# Patient Record
Sex: Female | Born: 1950
Health system: Southern US, Community
[De-identification: ages and names within clinical notes are randomized; demographics above are authoritative.]

## PROBLEM LIST (undated history)

## (undated) DIAGNOSIS — I639 Cerebral infarction, unspecified: Secondary | ICD-10-CM

## (undated) DIAGNOSIS — I10 Essential (primary) hypertension: Secondary | ICD-10-CM

## (undated) DIAGNOSIS — E782 Mixed hyperlipidemia: Secondary | ICD-10-CM

## (undated) DIAGNOSIS — E559 Vitamin D deficiency, unspecified: Secondary | ICD-10-CM

## (undated) DIAGNOSIS — R7302 Impaired glucose tolerance (oral): Secondary | ICD-10-CM

## (undated) DIAGNOSIS — I63532 Cerebral infarction due to unspecified occlusion or stenosis of left posterior cerebral artery: Secondary | ICD-10-CM

## (undated) DIAGNOSIS — J3089 Other allergic rhinitis: Secondary | ICD-10-CM

## (undated) HISTORY — PX: OTHER SURGICAL HISTORY: SHX169

## (undated) HISTORY — DX: Essential (primary) hypertension: I10

## (undated) HISTORY — DX: Vitamin D deficiency, unspecified: E55.9

## (undated) HISTORY — DX: Cerebral infarction due to unspecified occlusion or stenosis of left posterior cerebral artery: I63.532

## (undated) HISTORY — DX: Impaired glucose tolerance (oral): R73.02

## (undated) HISTORY — DX: Other allergic rhinitis: J30.89

## (undated) HISTORY — DX: Mixed hyperlipidemia: E78.2

---

## 2016-05-03 ENCOUNTER — Other Ambulatory Visit: Payer: Self-pay | Admitting: Physician Assistant

## 2016-05-03 DIAGNOSIS — Z1231 Encounter for screening mammogram for malignant neoplasm of breast: Secondary | ICD-10-CM

## 2016-05-31 ENCOUNTER — Encounter (HOSPITAL_COMMUNITY): Payer: Self-pay

## 2016-05-31 ENCOUNTER — Ambulatory Visit
Admission: RE | Admit: 2016-05-31 | Discharge: 2016-05-31 | Disposition: A | Discharge status: Home / Self Care | Payer: No Typology Code available for payment source | Source: Ambulatory Visit | Attending: Physician Assistant | Admitting: Physician Assistant

## 2016-05-31 DIAGNOSIS — R928 Other abnormal and inconclusive findings on diagnostic imaging of breast: Secondary | ICD-10-CM | POA: Insufficient documentation

## 2016-05-31 DIAGNOSIS — Z1231 Encounter for screening mammogram for malignant neoplasm of breast: Secondary | ICD-10-CM

## 2016-06-08 ENCOUNTER — Other Ambulatory Visit: Payer: Self-pay | Admitting: Physician Assistant

## 2016-06-08 DIAGNOSIS — R928 Other abnormal and inconclusive findings on diagnostic imaging of breast: Secondary | ICD-10-CM

## 2016-06-08 DIAGNOSIS — N6320 Unspecified lump in the left breast, unspecified quadrant: Secondary | ICD-10-CM

## 2016-06-13 ENCOUNTER — Ambulatory Visit
Admission: RE | Admit: 2016-06-13 | Discharge: 2016-06-13 | Disposition: A | Discharge status: Home / Self Care | Payer: No Typology Code available for payment source | Source: Ambulatory Visit | Attending: Physician Assistant | Admitting: Physician Assistant

## 2016-06-13 DIAGNOSIS — N6320 Unspecified lump in the left breast, unspecified quadrant: Secondary | ICD-10-CM

## 2016-06-13 DIAGNOSIS — R928 Other abnormal and inconclusive findings on diagnostic imaging of breast: Secondary | ICD-10-CM

## 2016-06-13 DIAGNOSIS — N6002 Solitary cyst of left breast: Secondary | ICD-10-CM | POA: Diagnosis not present

## 2016-06-16 ENCOUNTER — Other Ambulatory Visit: Payer: Self-pay | Admitting: Physician Assistant

## 2016-06-16 DIAGNOSIS — D242 Benign neoplasm of left breast: Secondary | ICD-10-CM

## 2016-12-18 ENCOUNTER — Ambulatory Visit: Payer: PRIVATE HEALTH INSURANCE | Attending: Physician Assistant

## 2018-03-06 ENCOUNTER — Other Ambulatory Visit: Payer: Self-pay | Admitting: Family Medicine

## 2018-03-06 DIAGNOSIS — N6320 Unspecified lump in the left breast, unspecified quadrant: Secondary | ICD-10-CM

## 2018-03-13 ENCOUNTER — Ambulatory Visit
Admission: RE | Admit: 2018-03-13 | Discharge: 2018-03-13 | Disposition: A | Discharge status: Home / Self Care | Payer: PRIVATE HEALTH INSURANCE | Source: Ambulatory Visit | Attending: Family Medicine | Admitting: Family Medicine

## 2018-03-13 ENCOUNTER — Ambulatory Visit
Admission: RE | Admit: 2018-03-13 | Discharge: 2018-03-13 | Disposition: A | Discharge status: Home / Self Care | Payer: Medicare HMO | Source: Ambulatory Visit | Attending: Family Medicine | Admitting: Family Medicine

## 2018-03-13 DIAGNOSIS — N6320 Unspecified lump in the left breast, unspecified quadrant: Secondary | ICD-10-CM

## 2021-02-14 ENCOUNTER — Emergency Department: Payer: Medicare HMO

## 2021-02-14 ENCOUNTER — Inpatient Hospital Stay
Admission: EM | Admit: 2021-02-14 | Discharge: 2021-02-18 | DRG: 065 | Disposition: A | Discharge status: Home Health | Payer: Medicare HMO | Attending: Internal Medicine | Admitting: Internal Medicine

## 2021-02-14 ENCOUNTER — Other Ambulatory Visit: Payer: Self-pay

## 2021-02-14 DIAGNOSIS — Z79899 Other long term (current) drug therapy: Secondary | ICD-10-CM | POA: Diagnosis not present

## 2021-02-14 DIAGNOSIS — J323 Chronic sphenoidal sinusitis: Secondary | ICD-10-CM | POA: Diagnosis not present

## 2021-02-14 DIAGNOSIS — R4182 Altered mental status, unspecified: Secondary | ICD-10-CM | POA: Diagnosis not present

## 2021-02-14 DIAGNOSIS — Z9114 Patient's other noncompliance with medication regimen: Secondary | ICD-10-CM | POA: Diagnosis not present

## 2021-02-14 DIAGNOSIS — J0110 Acute frontal sinusitis, unspecified: Secondary | ICD-10-CM | POA: Diagnosis not present

## 2021-02-14 DIAGNOSIS — R29701 NIHSS score 1: Secondary | ICD-10-CM | POA: Diagnosis present

## 2021-02-14 DIAGNOSIS — G8194 Hemiplegia, unspecified affecting left nondominant side: Secondary | ICD-10-CM | POA: Diagnosis present

## 2021-02-14 DIAGNOSIS — J329 Chronic sinusitis, unspecified: Secondary | ICD-10-CM | POA: Diagnosis present

## 2021-02-14 DIAGNOSIS — R5381 Other malaise: Secondary | ICD-10-CM | POA: Diagnosis not present

## 2021-02-14 DIAGNOSIS — Z7982 Long term (current) use of aspirin: Secondary | ICD-10-CM | POA: Diagnosis not present

## 2021-02-14 DIAGNOSIS — G9349 Other encephalopathy: Secondary | ICD-10-CM | POA: Diagnosis present

## 2021-02-14 DIAGNOSIS — M47812 Spondylosis without myelopathy or radiculopathy, cervical region: Secondary | ICD-10-CM | POA: Diagnosis not present

## 2021-02-14 DIAGNOSIS — R410 Disorientation, unspecified: Secondary | ICD-10-CM | POA: Diagnosis not present

## 2021-02-14 DIAGNOSIS — R531 Weakness: Secondary | ICD-10-CM | POA: Diagnosis not present

## 2021-02-14 DIAGNOSIS — E7849 Other hyperlipidemia: Secondary | ICD-10-CM | POA: Diagnosis not present

## 2021-02-14 DIAGNOSIS — I639 Cerebral infarction, unspecified: Secondary | ICD-10-CM | POA: Diagnosis present

## 2021-02-14 DIAGNOSIS — E785 Hyperlipidemia, unspecified: Secondary | ICD-10-CM | POA: Diagnosis not present

## 2021-02-14 DIAGNOSIS — J320 Chronic maxillary sinusitis: Secondary | ICD-10-CM | POA: Diagnosis not present

## 2021-02-14 DIAGNOSIS — I63432 Cerebral infarction due to embolism of left posterior cerebral artery: Principal | ICD-10-CM | POA: Diagnosis present

## 2021-02-14 DIAGNOSIS — Z6841 Body Mass Index (BMI) 40.0 and over, adult: Secondary | ICD-10-CM | POA: Diagnosis not present

## 2021-02-14 DIAGNOSIS — Z20822 Contact with and (suspected) exposure to covid-19: Secondary | ICD-10-CM | POA: Diagnosis not present

## 2021-02-14 DIAGNOSIS — I614 Nontraumatic intracerebral hemorrhage in cerebellum: Secondary | ICD-10-CM | POA: Diagnosis not present

## 2021-02-14 DIAGNOSIS — I160 Hypertensive urgency: Secondary | ICD-10-CM | POA: Diagnosis not present

## 2021-02-14 DIAGNOSIS — G459 Transient cerebral ischemic attack, unspecified: Secondary | ICD-10-CM | POA: Diagnosis not present

## 2021-02-14 DIAGNOSIS — I6522 Occlusion and stenosis of left carotid artery: Secondary | ICD-10-CM | POA: Diagnosis not present

## 2021-02-14 DIAGNOSIS — I6523 Occlusion and stenosis of bilateral carotid arteries: Secondary | ICD-10-CM | POA: Diagnosis not present

## 2021-02-14 DIAGNOSIS — R930 Abnormal findings on diagnostic imaging of skull and head, not elsewhere classified: Secondary | ICD-10-CM | POA: Diagnosis present

## 2021-02-14 DIAGNOSIS — J324 Chronic pansinusitis: Secondary | ICD-10-CM | POA: Diagnosis not present

## 2021-02-14 DIAGNOSIS — I6782 Cerebral ischemia: Secondary | ICD-10-CM | POA: Diagnosis not present

## 2021-02-14 DIAGNOSIS — R5383 Other fatigue: Secondary | ICD-10-CM

## 2021-02-14 DIAGNOSIS — I10 Essential (primary) hypertension: Secondary | ICD-10-CM | POA: Diagnosis not present

## 2021-02-14 DIAGNOSIS — I161 Hypertensive emergency: Secondary | ICD-10-CM | POA: Diagnosis not present

## 2021-02-14 DIAGNOSIS — I6381 Other cerebral infarction due to occlusion or stenosis of small artery: Secondary | ICD-10-CM | POA: Diagnosis not present

## 2021-02-14 DIAGNOSIS — I63532 Cerebral infarction due to unspecified occlusion or stenosis of left posterior cerebral artery: Secondary | ICD-10-CM | POA: Diagnosis present

## 2021-02-14 DIAGNOSIS — Q283 Other malformations of cerebral vessels: Secondary | ICD-10-CM | POA: Diagnosis not present

## 2021-02-14 LAB — CBC
HCT: 40.4 % (ref 36.0–46.0)
Hemoglobin: 13 g/dL (ref 12.0–15.0)
MCH: 31.4 pg (ref 26.0–34.0)
MCHC: 32.2 g/dL (ref 30.0–36.0)
MCV: 97.6 fL (ref 80.0–100.0)
Platelets: 276 10*3/uL (ref 150–400)
RBC: 4.14 MIL/uL (ref 3.87–5.11)
RDW: 13.4 % (ref 11.5–15.5)
WBC: 7.9 10*3/uL (ref 4.0–10.5)
nRBC: 0 % (ref 0.0–0.2)

## 2021-02-14 LAB — BASIC METABOLIC PANEL
Anion gap: 9 (ref 5–15)
BUN: 21 mg/dL (ref 8–23)
CO2: 28 mmol/L (ref 22–32)
Calcium: 9.3 mg/dL (ref 8.9–10.3)
Chloride: 101 mmol/L (ref 98–111)
Creatinine, Ser: 0.96 mg/dL (ref 0.44–1.00)
GFR, Estimated: >60 mL/min (ref >60)
Glucose, Bld: 112 mg/dL — ABNORMAL HIGH (ref 70–99)
Potassium: 3.6 mmol/L (ref 3.5–5.1)
Sodium: 138 mmol/L (ref 135–145)

## 2021-02-14 LAB — RESP PANEL BY RT-PCR (FLU A&B, COVID) ARPGX2
Influenza A by PCR: NEGATIVE
Influenza B by PCR: NEGATIVE
SARS Coronavirus 2 by RT PCR: NEGATIVE

## 2021-02-14 MED ORDER — ENOXAPARIN SODIUM 60 MG/0.6ML IJ SOSY
0.5000 mg/kg | PREFILLED_SYRINGE | INTRAMUSCULAR | Status: DC
  Administered 2021-02-15 – 2021-02-18 (×4): 57.5 mg via SUBCUTANEOUS
  Filled 2021-02-14 (×4): qty 0.6

## 2021-02-14 MED ORDER — SODIUM CHLORIDE 0.9 % IV SOLN
1.5000 g | Freq: Once | INTRAVENOUS | Status: AC
  Administered 2021-02-15: 1.5 g via INTRAVENOUS
  Filled 2021-02-14: qty 1.5

## 2021-02-14 MED ORDER — STROKE: EARLY STAGES OF RECOVERY BOOK: Freq: Once | Status: DC

## 2021-02-14 MED ORDER — ACETAMINOPHEN 160 MG/5ML PO SOLN
650.0000 mg | ORAL | Status: DC | PRN
  Filled 2021-02-14: qty 20.3

## 2021-02-14 MED ORDER — ACETAMINOPHEN 650 MG RE SUPP: 650.0000 mg | RECTAL | Status: DC | PRN

## 2021-02-14 MED ORDER — ACETAMINOPHEN 325 MG PO TABS
650.0000 mg | ORAL_TABLET | ORAL | Status: DC | PRN
  Administered 2021-02-15 – 2021-02-18 (×4): 650 mg via ORAL
  Filled 2021-02-14 (×4): qty 2

## 2021-02-14 MED ORDER — SODIUM CHLORIDE 0.9 % IV SOLN: INTRAVENOUS | Status: DC

## 2021-02-14 NOTE — ED Notes (Signed)
Attempted to  start IV on pt. Unsuccessful

## 2021-02-14 NOTE — H&P (Signed)
History and Physical    Karen Chan PJK:[id number] DOB: March 19, 1950 DOA: 02/14/2021  PCP: Buzzy Han, MD   Patient coming from: home  I have personally briefly reviewed patient's relevant medical records in [place]  Chief Complaint: confusion x 1 week  HPI: Karen Chan is a 71 y.o. female with medical history significant for HTN who was brought in by her family with a 1 week history of mild confusion.  Most of the history is given by husband at bedside who stated for the past week patient seemed to have difficulty getting her words out.  About 3 days ago patient stated that she was driving to a familiar place but seem to have trouble finding the place and had to stop about 3 times to try to think and figure out how to get to where she was going.  Due to persistent symptoms along with dizziness they decided to come into the emergency room to get evaluated  ED course: BP 192/109 - 208/97 with otherwise normal vitals Blood work unremarkable  EKG, personally viewed and interpreted: NSR at 91 with no acute ST-T wave changes  Imaging: CT head: Hypodensity left occipital lobe concerning for acute infarct Air-fluid level left sphenoid sinus concerning for sinusitis, correlate with symptoms  MRI ordered from the ED, result pending.  Started on Unasyn for possible sinusitis.  Hospitalist consulted for admission.  Chest   Review of Systems: As per HPI otherwise all other systems on review of systems negative.   Assessment/Plan  Acute confusion - Differential includes acute stroke given abnormality on CT head,PRES related to uncontrolled hypertension, less likely metabolic encephalopathy related to acute infectious process - Follow MRI - Neurologic checks with fall and aspiration precautions  Probable acute CVA - Acute confusion without focal deficit, with last known well a week prior - Follow MRI - Stroke work-up to include continuous cardiac  monitoring, carotid Doppler and echocardiogram - We will give aspirin - Permissive hypertension to systolic 809, maintain euglycemia and normothermia - Lipid profile - Neurology consult -Addendum: MRI confirms acute CVA  Hypertensive urgency - Hold antihypertensives for now allowing for permissive hypertension for the next 24 hours pending MRI results    Sinusitis - CT head showing left sphenoid sinusitis - Antibiotics started in the ED.  Not continued at this time - Decongestants, Flonase nasal spray, symptomatic treatment   DVT prophylaxis: Lovenox  Code Status: full code  Family Communication:  none  Disposition Plan: Back to previous home environment Consults called: Neurology Status:At the time of admission, it appears that the appropriate admission status for this patient is INPATIENT. This is judged to be reasonable and necessary in order to provide the required intensity of service to ensure the patient's safety given the presenting symptoms, physical exam findings, and initial radiographic and laboratory data in the context of their  Comorbid conditions.   Patient requires inpatient status due to high intensity of service, high risk for further deterioration and high frequency of surveillance required.   I certify that at the point of admission it is my clinical judgment that the patient will require inpatient hospital care spanning beyond 2 midnights     Physical Exam: Vitals:   02/14/21 1403 02/14/21 2152  BP: (!) 192/109 (!) 208/97  Pulse: 86 84  Resp: 16 17  Temp: 98.4 F (36.9 C) 98.2 F (36.8 C)  TempSrc: Oral Oral  SpO2: 95% 98%   Constitutional: Alert, oriented x 3 . Not in any apparent  distress HEENT:      Head: Normocephalic and atraumatic.         Eyes: PERLA, EOMI, Conjunctivae are normal. Sclera is non-icteric.       Mouth/Throat: Mucous membranes are moist.       Neck: Supple with no signs of meningismus. Cardiovascular: Regular rate and rhythm. No  murmurs, gallops, or rubs. 2+ symmetrical distal pulses are present . No JVD. No  LE edema Respiratory: Respiratory effort normal .Lungs sounds clear bilaterally. No wheezes, crackles, or rhonchi.  Gastrointestinal: Soft, non tender, non distended. Positive bowel sounds.  Genitourinary: No CVA tenderness. Musculoskeletal: Nontender with normal range of motion in all extremities. No cyanosis, or erythema of extremities. Neurologic:  Face is symmetric. Moving all extremities. No gross focal neurologic deficits . Skin: Skin is warm, dry.  No rash or ulcers Psychiatric: Mood and affect are appropriate     History reviewed. No pertinent past medical history.  History reviewed. No pertinent surgical history.   has no history on file for tobacco use, alcohol use, and drug use.  Not on File  Family History  Problem Relation Age of Onset   Breast cancer Neg Hx       Prior to Admission medications   Not on File      Labs on Admission: I have personally reviewed following labs and imaging studies  CBC: Recent Labs  Lab 02/14/21 1540  WBC 7.9  HGB 13.0  HCT 40.4  MCV 97.6  PLT 478   Basic Metabolic Panel: Recent Labs  Lab 02/14/21 1540  NA 138  K 3.6  CL 101  CO2 28  GLUCOSE 112*  BUN 21  CREATININE 0.96  CALCIUM 9.3   GFR: CrCl cannot be calculated (Unknown ideal weight.). Liver Function Tests: No results for input(s): AST, ALT, ALKPHOS, BILITOT, PROT, ALBUMIN in the last 168 hours. No results for input(s): LIPASE, AMYLASE in the last 168 hours. No results for input(s): AMMONIA in the last 168 hours. Coagulation Profile: No results for input(s): INR, PROTIME in the last 168 hours. Cardiac Enzymes: No results for input(s): CKTOTAL, CKMB, CKMBINDEX, TROPONINI in the last 168 hours. BNP (last 3 results) No results for input(s): PROBNP in the last 8760 hours. HbA1C: No results for input(s): HGBA1C in the last 72 hours. CBG: No results for input(s): GLUCAP in  the last 168 hours. Lipid Profile: No results for input(s): CHOL, HDL, LDLCALC, TRIG, CHOLHDL, LDLDIRECT in the last 72 hours. Thyroid Function Tests: No results for input(s): TSH, T4TOTAL, FREET4, T3FREE, THYROIDAB in the last 72 hours. Anemia Panel: No results for input(s): VITAMINB12, FOLATE, FERRITIN, TIBC, IRON, RETICCTPCT in the last 72 hours. Urine analysis: No results found for: COLORURINE, APPEARANCEUR, LABSPEC, PHURINE, GLUCOSEU, HGBUR, BILIRUBINUR, KETONESUR, PROTEINUR, UROBILINOGEN, NITRITE, LEUKOCYTESUR  Radiological Exams on Admission: CT HEAD WO CONTRAST (5MM)  Result Date: 02/14/2021 CLINICAL DATA:  Altered mental status and weakness EXAM: CT HEAD WITHOUT CONTRAST TECHNIQUE: Contiguous axial images were obtained from the base of the skull through the vertex without intravenous contrast. COMPARISON:  None. FINDINGS: Brain: Hypodensity in the left occipital lobe (series 3, image 15). No acute hemorrhage, mass, mass effect, or midline shift. Lacunar infarcts in the bilateral basal ganglia and left thalamus. No hydrocephalus or extra-axial collection. Vascular: No hyperdense vessel. Skull: Normal. Negative for fracture or focal lesion. Sinuses/Orbits: Complete opacification of the bilateral maxillary sinuses, with partial opacification of the ethmoid air cells, left frontal sinus, and left sphenoid sinus, which demonstrates an air-fluid level. The  orbits are unremarkable. Other: Fluid in the left-greater-than-right mastoid air cells. IMPRESSION: 1. Hypodensity in the left occipital lobe, concerning for acute infarct. 2. Air-fluid level in left sphenoid sinus with opacification of most of the and sinuses, concerning for sinusitis. Correlate with symptoms. These results were called by telephone at the time of interpretation on 02/14/2021 at 2:20 pm to provider [facility] , who verbally acknowledged these results. Electronically Signed   By: Merilyn Baba M.D.   On: 02/14/2021 14:20        Athena Masse MD [facility]   02/14/2021, 10:27 PM

## 2021-02-14 NOTE — ED Provider Notes (Signed)
[facility] Provider Note    Event Date/Time   First MD Initiated Contact with Patient 02/14/21 2136     (approximate)   History   Altered Mental Status and Weakness   HPI  Karen Chan is a 71 y.o. female with hypertension who comes in with weakness.  Patient reports having some weakness and confusion over the past week.  She is not able to tell me exactly when her last known normal was but states that she feels like its been at least a week.  Family's member is a Marine scientist who reports concerned that she might of had a stroke.  She denies any vision changes, maybe a little bit of difficulty walking.  She reports just feeling like she has a head cold and a lot of congestion.  Physical Exam   Triage Vital Signs: ED Triage Vitals  Enc Vitals Group     BP 02/14/21 1403 (!) 192/109     Pulse Rate 02/14/21 1403 86     Resp 02/14/21 1403 16     Temp 02/14/21 1403 98.4 F (36.9 C)     Temp Source 02/14/21 1403 Oral     SpO2 02/14/21 1403 95 %     Weight --      Height --      Head Circumference --      Peak Flow --      Pain Score 02/14/21 1341 0     Pain Loc --      Pain Edu? --      Excl. in [place]? --     Most recent vital signs: Vitals:   02/14/21 1403  BP: (!) 192/109  Pulse: 86  Resp: 16  Temp: 98.4 F (36.9 C)  SpO2: 95%     General: Awake, no distress.  Pleasant female although appears confused CV:  Good peripheral perfusion.  Resp:  Normal effort.  Abd:  No distention.  Neurological: Cranial nerves appear intact although patient does appear confused and often losing her train of thought.   ED Results / Procedures / Treatments   Labs (all labs ordered are listed, but only abnormal results are displayed) Labs Reviewed  BASIC METABOLIC PANEL - Abnormal; Notable for the following components:      Result Value   Glucose, Bld 112 (*)    All other components within normal limits  CBC  URINALYSIS, ROUTINE W REFLEX MICROSCOPIC   CBG MONITORING, ED     EKG  My interpretation of EKG: Normal sinus rate of 99 without ST elevation or T wave inversions, normal intervals    RADIOLOGY I have reviewed the xray personally and agree with radiology read Concerning for hypodensity in the left septal lobe concerning for an acute infarct.  Also concern for some sinusitis  PROCEDURES:  Critical Care performed: No  Procedures   MEDICATIONS ORDERED IN ED: Medications  ampicillin-sulbactam (UNASYN) 1.5 g in sodium chloride 0.9 % 100 mL IVPB (has no administration in time range)   stroke: mapping our early stages of recovery book ( Does not apply Not Given 02/14/21 2315)  0.9 %  sodium chloride infusion (has no administration in time range)  acetaminophen (TYLENOL) tablet 650 mg (has no administration in time range)    Or  acetaminophen (TYLENOL) 160 MG/5ML solution 650 mg (has no administration in time range)    Or  acetaminophen (TYLENOL) suppository 650 mg (has no administration in time range)  enoxaparin (LOVENOX) injection 40 mg (has no  administration in time range)     IMPRESSION / MDM / ASSESSMENT AND PLAN / ED COURSE  I reviewed the triage vital signs and the nursing notes.                              Differential diagnosis includes, but is not limited to, hemorrhage, ischemic stroke given the confusion CT head was ordered.  Also concerning for COVID, flu given some congestion for sinusitis.  CT imaging concerning for an acute infarct in the S middle lobe.  We will add on MRI but given patient's confusion I suspect that this is an infarct.  COVID, flu are negative.  Labs are reassuring without any electrolyte abnormalities.  No white count to suggest infection.  Will discuss to the hospital team for admission due to concern for stroke.  Last known normal was a week ago therefore not a tPA or LVO candidate   FINAL CLINICAL IMPRESSION(S) / ED DIAGNOSES   Final diagnoses:  Acute confusion     Rx /  DC Orders   ED Discharge Orders     None        Note:  This document was prepared using Dragon voice recognition software and may include unintentional dictation errors.   Vanessa [place], MD 02/15/21 Dyann Kief

## 2021-02-14 NOTE — ED Triage Notes (Signed)
Pt comes via EMs with c/o some AMS and weakness. Pt state she just doesn't feel right and has had some dizziness. Pt was little unsteady when walking per EMs  Pt was HTN at 160/120 and currently takes 3 different BP meds. Other vitals stable.

## 2021-02-15 ENCOUNTER — Inpatient Hospital Stay: Admit: 2021-02-15 | Payer: Medicare HMO

## 2021-02-15 ENCOUNTER — Inpatient Hospital Stay: Payer: Medicare HMO

## 2021-02-15 ENCOUNTER — Encounter: Payer: Self-pay | Admitting: Internal Medicine

## 2021-02-15 DIAGNOSIS — Z9114 Patient's other noncompliance with medication regimen: Secondary | ICD-10-CM

## 2021-02-15 DIAGNOSIS — I161 Hypertensive emergency: Secondary | ICD-10-CM | POA: Diagnosis present

## 2021-02-15 DIAGNOSIS — J320 Chronic maxillary sinusitis: Secondary | ICD-10-CM | POA: Diagnosis not present

## 2021-02-15 DIAGNOSIS — J0110 Acute frontal sinusitis, unspecified: Secondary | ICD-10-CM

## 2021-02-15 DIAGNOSIS — I639 Cerebral infarction, unspecified: Secondary | ICD-10-CM | POA: Diagnosis not present

## 2021-02-15 DIAGNOSIS — R410 Disorientation, unspecified: Secondary | ICD-10-CM | POA: Diagnosis not present

## 2021-02-15 DIAGNOSIS — I63532 Cerebral infarction due to unspecified occlusion or stenosis of left posterior cerebral artery: Secondary | ICD-10-CM | POA: Diagnosis present

## 2021-02-15 DIAGNOSIS — Q283 Other malformations of cerebral vessels: Secondary | ICD-10-CM | POA: Diagnosis not present

## 2021-02-15 DIAGNOSIS — I6522 Occlusion and stenosis of left carotid artery: Secondary | ICD-10-CM | POA: Diagnosis not present

## 2021-02-15 DIAGNOSIS — E785 Hyperlipidemia, unspecified: Secondary | ICD-10-CM

## 2021-02-15 DIAGNOSIS — M47812 Spondylosis without myelopathy or radiculopathy, cervical region: Secondary | ICD-10-CM | POA: Diagnosis not present

## 2021-02-15 DIAGNOSIS — I6523 Occlusion and stenosis of bilateral carotid arteries: Secondary | ICD-10-CM | POA: Diagnosis not present

## 2021-02-15 DIAGNOSIS — I10 Essential (primary) hypertension: Secondary | ICD-10-CM

## 2021-02-15 LAB — CBC
HCT: 44.2 % (ref 36.0–46.0)
Hemoglobin: 14.1 g/dL (ref 12.0–15.0)
MCH: 31.3 pg (ref 26.0–34.0)
MCHC: 31.9 g/dL (ref 30.0–36.0)
MCV: 98 fL (ref 80.0–100.0)
Platelets: 265 10*3/uL (ref 150–400)
RBC: 4.51 MIL/uL (ref 3.87–5.11)
RDW: 13.5 % (ref 11.5–15.5)
WBC: 7.7 10*3/uL (ref 4.0–10.5)
nRBC: 0 % (ref 0.0–0.2)

## 2021-02-15 LAB — LIPID PANEL
Cholesterol: 214 mg/dL — ABNORMAL HIGH (ref 0–200)
HDL: 67 mg/dL (ref >40)
LDL Cholesterol: 129 mg/dL — ABNORMAL HIGH (ref 0–99)
Total CHOL/HDL Ratio: 3.2 RATIO
Triglycerides: 89 mg/dL (ref <150)
VLDL: 18 mg/dL (ref 0–40)

## 2021-02-15 LAB — CREATININE, SERUM
Creatinine, Ser: 0.82 mg/dL (ref 0.44–1.00)
GFR, Estimated: >60 mL/min (ref >60)

## 2021-02-15 LAB — HEMOGLOBIN A1C
Hgb A1c MFr Bld: 6.1 % — ABNORMAL HIGH (ref 4.8–5.6)
Mean Plasma Glucose: 128.37 mg/dL

## 2021-02-15 LAB — HIV ANTIBODY (ROUTINE TESTING W REFLEX): HIV Screen 4th Generation wRfx: NONREACTIVE

## 2021-02-15 LAB — URINALYSIS, ROUTINE W REFLEX MICROSCOPIC
Bilirubin Urine: NEGATIVE
Glucose, UA: NEGATIVE mg/dL
Hgb urine dipstick: NEGATIVE
Ketones, ur: NEGATIVE mg/dL
Leukocytes,Ua: NEGATIVE
Nitrite: NEGATIVE
Protein, ur: NEGATIVE mg/dL
Specific Gravity, Urine: >1.046 — ABNORMAL HIGH (ref 1.005–1.030)
pH: 6 (ref 5.0–8.0)

## 2021-02-15 MED ORDER — LABETALOL HCL 5 MG/ML IV SOLN
10.0000 mg | Freq: Once | INTRAVENOUS | Status: AC
  Administered 2021-02-15: 02:00:00 10 mg via INTRAVENOUS
  Filled 2021-02-15: qty 4

## 2021-02-15 MED ORDER — CARVEDILOL PHOSPHATE ER 10 MG PO CP24
10.0000 mg | ORAL_CAPSULE | Freq: Every day | ORAL | Status: DC
  Administered 2021-02-15 – 2021-02-16 (×2): 10 mg via ORAL
  Filled 2021-02-15 (×3): qty 1

## 2021-02-15 MED ORDER — IOHEXOL 350 MG/ML SOLN
75.0000 mL | Freq: Once | INTRAVENOUS | Status: AC | PRN
  Administered 2021-02-15: 15:00:00 75 mL via INTRAVENOUS

## 2021-02-15 MED ORDER — IRBESARTAN 150 MG PO TABS
75.0000 mg | ORAL_TABLET | Freq: Every day | ORAL | Status: DC
  Administered 2021-02-15 – 2021-02-16 (×2): 75 mg via ORAL
  Filled 2021-02-15 (×2): qty 1

## 2021-02-15 NOTE — Progress Notes (Signed)
PT Cancellation Note  Patient Details Name: Karen Chan MRN: [id number] DOB: 1950-12-22   Cancelled Treatment:    Reason Eval/Treat Not Completed: Patient at procedure or test/unavailable Patient is out of the room at this time. PT will return later for PT evaluation when patient available.   Minna Merritts, PT, MPT   Percell Locus 02/15/2021, 8:42 AM

## 2021-02-15 NOTE — Evaluation (Signed)
Occupational Therapy Evaluation Patient Details Name: Karen Chan MRN: [id number] DOB: 05/24/1950 Today's Date: 02/15/2021   History of Present Illness Patient is a 71 year old female with medical history significant for HTN who was brought in by her family with a 1 week history of mild confusion. Acute to early subacute left PCA distribution infarct on report of brain MRI.   Clinical Impression   Ms Karen Chan was seen for OT evaluation this date. Prior to hospital admission, pt was Independent for mobility and I/ADLs including driving. Pt lives with spouse available PRN. Pt presents to acute OT demonstrating impaired ADL performance and functional mobility 2/2 decreased activity tolerance, decreased safety awareness, and functional balance deficits. Pt states her [age] as 71 then 71 then 71 (pt unable to identify 9 as incorrect age without cues). Pt unable to state city as [place] when given choices and cues.   Pt currently requires CGA for toilet t/f - noted R visual neglect bumping into objects. MIN A don R sock seated EOB. MIN cues + SBA tooth brushing standing sink side - cues for opening tooth paste, difficulty sequencing noted. MIN A call and place meal order. Pt completed visual scanning task correctly identifying 29/31 numbers sets however noted to compensate with R head turns/R lateral lean.   The SLUMS is a 30-point screening questionnaire that tests orientation, memory, attention, problem solving, and executive function. Pt scored a 16/30 indicating the need for further assessment of Dementia. Pt with noted impairments in short term memory and orientation limiting ability to participate functionally in IADLs safely. Pt would benefit from skilled OT to address noted impairments and functional limitations (see below for any additional details). Upon hospital discharge, recommend Outpatient OT to maximize pt safety and return to PLOF.       Recommendations for follow up therapy  are one component of a multi-disciplinary discharge planning process, led by the attending physician.  Recommendations may be updated based on patient status, additional functional criteria and insurance authorization.   Follow Up Recommendations  Outpatient OT    Assistance Recommended at Discharge Intermittent Supervision/Assistance  Patient can return home with the following A little help with walking and/or transfers;A little help with bathing/dressing/bathroom;Assistance with cooking/housework;Direct supervision/assist for medications management;Assist for transportation    Functional Status Assessment  Patient has had a recent decline in their functional status and demonstrates the ability to make significant improvements in function in a reasonable and predictable amount of time.  Equipment Recommendations  BSC/3in1    Recommendations for Other Services       Precautions / Restrictions Precautions Precautions: Fall Restrictions Weight Bearing Restrictions: No      Mobility Bed Mobility Overal bed mobility: Modified Independent                  Transfers Overall transfer level: Needs assistance Equipment used: None Transfers: Sit to/from Stand Sit to Stand: Supervision                  Balance Overall balance assessment: Needs assistance Sitting-balance support: No upper extremity supported Sitting balance-Leahy Scale: Good     Standing balance support: During functional activity;No upper extremity supported Standing balance-Leahy Scale: Fair Standing balance comment: reaching for UE support during mobility to stabilize                           ADL either performed or assessed with clinical judgement   ADL Overall ADL's :  Needs assistance/impaired                                       General ADL Comments: CGA for toilet t/f - noted R visual neglect bumping into objects. MIN A don R sock seated EOB. MIN cues + SBA  tooth brushing standing sink side - cues for opening tooth paste, difficulty sequencing noted.      Pertinent Vitals/Pain Pain Assessment: No/denies pain     Hand Dominance Right   Extremity/Trunk Assessment Upper Extremity Assessment Upper Extremity Assessment: Overall WFL for tasks assessed   Lower Extremity Assessment Lower Extremity Assessment: Generalized weakness       Communication Communication Communication: No difficulties   Cognition Arousal/Alertness: Awake/alert Behavior During Therapy: WFL for tasks assessed/performed Overall Cognitive Status: Impaired/Different from baseline Area of Impairment: Orientation;Problem solving                 Orientation Level: Place;Time           Problem Solving: Slow processing General Comments: SLUM, states her age as 71 then 71 then 71. unable to state city as [place] despite choices and cues     General Comments       Exercises Exercises: Other exercises Other Exercises Other Exercises: Pt and family educated re: OT role, DME recs, d/c recs, stroke education Other Exercises: visual scanning test and SLUMS   Shoulder Instructions      Home Living Family/patient expects to be discharged to:: Private residence Living Arrangements: Spouse/significant other Available Help at Discharge: Family;Available PRN/intermittently Type of Home: Apartment Home Access: Level entry     Home Layout: One level     Bathroom Shower/Tub: Walk-in shower         Home Equipment: None          Prior Functioning/Environment Prior Level of Function : Independent/Modified Independent;Driving             Mobility Comments: she reports possibly one fall in the past 6 months but unable to provide details. independent without use of assistive device for ambulation ADLs Comments: independent        OT Problem List: Decreased activity tolerance;Impaired balance (sitting and/or standing);Decreased  cognition;Decreased safety awareness      OT Treatment/Interventions: Self-care/ADL training;Therapeutic exercise;Energy conservation;DME and/or AE instruction;Therapeutic activities;Patient/family education;Balance training;Cognitive remediation/compensation;Visual/perceptual remediation/compensation    OT Goals(Current goals can be found in the care plan section) Acute Rehab OT Goals Patient Stated Goal: to return to driving\ OT Goal Formulation: With patient/family Time For Goal Achievement: 03/01/21 Potential to Achieve Goals: Good ADL Goals Pt Will Transfer to Toilet: Independently;ambulating;regular height toilet Additional ADL Goal #1: Pt will verbalize plan to implement x3 visual scanning strategies Additional ADL Goal #2: Pt will verbalize plan to implement x3 falls prevention strategies  OT Frequency: Min 3X/week    Co-evaluation              AM-PAC OT "6 Clicks" Daily Activity     Outcome Measure Help from another person eating meals?: None Help from another person taking care of personal grooming?: A Little Help from another person toileting, which includes using toliet, bedpan, or urinal?: A Little Help from another person bathing (including washing, rinsing, drying)?: A Little Help from another person to put on and taking off regular upper body clothing?: None Help from another person to put on and taking off regular lower body clothing?: A  Little 6 Click Score: 20   End of Session Nurse Communication: Mobility status  Activity Tolerance: Patient tolerated treatment well Patient left: in bed;with call bell/phone within reach;with nursing/sitter in room;with family/visitor present  OT Visit Diagnosis: Other abnormalities of gait and mobility (R26.89)                Time: 0164-2903 OT Time Calculation (min): 53 min Charges:  OT General Charges $OT Visit: 1 Visit OT Evaluation $OT Eval Moderate Complexity: 1 Mod OT Treatments $Self Care/Home Management :  23-37 mins $Therapeutic Activity: 8-22 mins  Dessie Coma, M.S. OTR/L  02/15/21, 3:42 PM  ascom [phone]

## 2021-02-15 NOTE — TOC Initial Note (Signed)
Transition of Care [facility]) - Initial/Assessment Note    Patient Details  Name: Karen Chan MRN: [id number] Date of Birth: 1950/10/25  [facility] of [facility]) CM/SW Contact:    Karen Pelt, RN Phone Number: 02/15/2021, 4:03 PM  Clinical Narrative:  Patient lives at home with husband, who can assist at discharge.  Patient has no concerns with transportation or medications at this time.    Patient has primary care provider however she plans to change to someone close to her geographically.  Patient is considering to keep her current provider until she can get another appointment.     Rolling walker and 3n1 recommended by therapy, to be delivered to patient's room prior to discharge and after insurance approval             Expected Discharge Plan: Home/Self Care (outpatient physical therapy) Barriers to Discharge: Continued Medical Work up   Patient Goals and CMS Choice     Choice offered to / list presented to : NA  Expected Discharge Plan and Services Expected Discharge Plan: Home/Self Care (outpatient physical therapy)   Discharge Planning Services: CM Consult Post Acute Care Choice: Durable Medical Equipment (outpatient physical therapy) Living arrangements for the past 2 months: Single Family Home                 DME Arranged: Walker rolling DME Agency: [employer] Date DME Agency Contacted: 02/15/21   Representative spoke with at DME Agency: Karen Chan            Prior Living Arrangements/Services Living arrangements for the past 2 months: Single Family Home Lives with:: Self, Spouse Patient language and need for interpreter reviewed:: Yes (no interpreter required) Do you feel safe going back to the place where you live?: Yes      Need for Family Participation in Patient Care: Yes (Comment) Care giver support system in place?: Yes (comment)   Criminal Activity/Legal Involvement Pertinent to Current Situation/Hospitalization: No - Comment as  needed  Activities of Daily Living Home Assistive Devices/Equipment: None ADL Screening (condition at time of admission) Patient's cognitive ability adequate to safely complete daily activities?: No Is the patient deaf or have difficulty hearing?: No Does the patient have difficulty seeing, even when wearing glasses/contacts?: Yes Does the patient have difficulty concentrating, remembering, or making decisions?: Yes Patient able to express need for assistance with ADLs?: Yes Does the patient have difficulty dressing or bathing?: No Independently performs ADLs?: Yes (appropriate for developmental age) Does the patient have difficulty walking or climbing stairs?: Yes Weakness of Legs: Both Weakness of Arms/Hands: None  Permission Sought/Granted Permission sought to share information with : Case Manager Permission granted to share information with : Yes, Verbal Permission Granted     Permission granted to share info w AGENCY: DME company        Emotional Assessment Appearance:: Appears stated age Attitude/Demeanor/Rapport: Gracious, Engaged Affect (typically observed): Pleasant, Appropriate Orientation: : Oriented to Self, Oriented to Place, Oriented to  Time, Oriented to Situation Alcohol / Substance Use: Not Applicable Psych Involvement: No (comment)  Admission diagnosis:  Acute confusion [R41.0] Patient Active Problem List   Diagnosis Date Noted   Cerebrovascular accident (CVA) ([place])    Hypertensive emergency    Generalized weakness 02/14/2021   Hypertensive urgency 02/14/2021   Abnormal CT of the head 02/14/2021   Acute confusion 02/14/2021   Sinusitis 02/14/2021   PCP:  Karen Han, MD Pharmacy:  No Pharmacies Listed    Social Determinants of Health (SDOH)  Interventions    Readmission Risk Interventions No flowsheet data found.

## 2021-02-15 NOTE — Progress Notes (Signed)
PHARMACIST - PHYSICIAN COMMUNICATION  CONCERNING:  Enoxaparin (Lovenox) for DVT Prophylaxis    RECOMMENDATION: Patient was prescribed enoxaprin 40mg  q24 hours for VTE prophylaxis.   Filed Weights   02/15/21 0058  Weight: 113.1 kg (249 lb 6.4 oz)    Body mass index is 47.12 kg/m.  Estimated Creatinine Clearance: 76.6 mL/min (by C-G formula based on SCr of 0.82 mg/dL).   Based on [facility] patient is candidate for enoxaparin 0.5mg /kg TBW SQ every 24 hours based on BMI being >30.  DESCRIPTION: Pharmacy has adjusted enoxaparin dose per [facility] policy.  Patient is now receiving enoxaparin 0.5 mg/kg every 24 hours   Renda Rolls, PharmD, [facility] 02/15/2021 3:09 AM

## 2021-02-15 NOTE — Progress Notes (Signed)
SLP Cancellation Note  Patient Details Name: Karen Chan MRN: [id number] DOB: 07/03/1950   Cancelled treatment:       Reason Eval/Treat Not Completed: Patient at procedure or test/unavailable (chart reviewed) Pt is currently out of the room at [facility]. ST services will f/u tomorrow w/ Cognitive-linguistic evaluation.      Orinda Kenner, MS, CCC-SLP Speech Language Pathologist Rehab Services [phone] [facility] 02/15/2021, 2:47 PM

## 2021-02-15 NOTE — Consult Note (Signed)
Neurology Consult H&P  Karen Chan MR# [id number] 02/15/2021   CC: acute stroke  History is obtained from: patient, family and chart.  HPI: Karen Chan is a 71 y.o. female PMHx as reviewed below admitted for weakness and confusion which had been ongoing for about 1 week. CT head showed area concerning for stroke and MRI brain confirmed.  In triage BP 192/109  Family at bedside stated she developed encephalopathy Thursday (02/10/2021).  LKW: unclear tNK given: No OSW IR Thrombectomy Not indicated Modified Rankin Scale: 0-Completely asymptomatic and back to baseline post- stroke NIHSS: 0  ROS: A complete ROS was performed and is negative except as noted in the HPI.   History reviewed. No pertinent past medical history.   Family History  Problem Relation Age of Onset   Breast cancer Neg Hx     Social History:  has no history on file for tobacco use, alcohol use, and drug use.   Prior to Admission medications   Not on File    Exam: Current vital signs: BP (!) 193/113 (BP Location: Left Arm)    Pulse (!) 106    Temp 99.3 F (37.4 C) (Oral)    Resp 18    Ht 5\' 1"  (1.549 m)    Wt 113.1 kg    SpO2 98%    BMI 47.12 kg/m   Physical Exam  Constitutional: Appears well-developed and well-nourished.  Psych: Affect appropriate to situation Eyes: No scleral injection HENT: No OP obstruction. Head: Normocephalic.  Cardiovascular: Normal rate and regular rhythm.  Respiratory: Effort normal, symmetric excursions bilaterally, no audible wheezing. GI: Soft.  No distension. There is no tenderness.  Skin: WDI  Neuro: Mental Status: Patient is awake, alert, oriented to person, place, month, year, and situation. Patient is able to give a clear and coherent history. Speech fluent, intact comprehension and repetition. No signs of aphasia or neglect. Visual Fields are full. Pupils are equal, round, and reactive to light. EOMI without ptosis or diploplia.  Facial  sensation is symmetric to temperature Facial movement is symmetric.  Hearing is intact to voice. Uvula midline and palate elevates symmetrically. Shoulder shrug is symmetric. Tongue is midline without atrophy or fasciculations.  Tone is normal. Bulk is normal. Left lower extremity ~4+/5 (chronic) and 5/5 in the rest of her extremities. Sensation is symmetric to light touch and temperature in the arms and legs. Deep Tendon Reflexes: 2+ and symmetric in the biceps and patellae. Toes are downgoing bilaterally. FNF and HKS are intact bilaterally. Gait - Deferred  I have reviewed labs in epic and the pertinent results are: LDL 129  I have reviewed the images obtained: MRI brain showed acute-early subacute left PCA infarction, mild petechial hemorrhage without frank hemorrhagic transformation or significant mass effect. Underlying advanced chronic microvascular ischemic disease with multiple remote lacunar infarcts involving the hemispheric cerebral white matter, deep gray nuclei, and pons. Innumerable scattered chronic micro hemorrhages involving both cerebral hemispheres and cerebellum, nonspecific, but could be related to chronic poorly controlled hypertension or possibly cerebral amyloid angiopathy.   Assessment: Karen Chan is a 71 y.o. female PMHx HTN, mild chronic left lower extremity weakness with acute encephalopathy in hypertensive emergency and MRI brain showing acute-early subacute left PCA embolic stroke. MRI also showed multiple cerebral microbleeds.  She states that she has HTN and is prescribed several medications however she does not take them. At home her SBPs routinely run in the 170-200s.  Cerebral microbleeds in lobar and deep in the brain  stem and thalami without macrobleeds in setting of uncontrolled hypertension suggests likely mixed cerebral microbleeds being more likely than cerebral amyloid angiopathy.  Impressed upon her the importance of controlling her blood  pressure by taking her antihypertensives as directed.   Impression:  Hypertensive emergency Acute-subacute left PCA stroke Mixed cerebral microbleeds secondary to uncontrolled HTN. Uncontrolled HTN HLD   Plan: - Recommend CTA head and neck - Ordered. - TTE - Read pending. - Recommend labs: HbA1c - Pending. - Recommend Statin if LDL > 70 - Continue aspirin 81mg  daily. - SBP goal <140/90. - Telemetry monitoring for arrhythmia. - Recommend bedside Swallow screen. - Recommend Stroke education. - Recommend PT/OT/SLP consult.  Electronically signed by:  Lynnae Sandhoff, MD Page: [phone] 02/15/2021, 9:10 AM  If 7pm- 7am, please page neurology on call as listed in [place].+

## 2021-02-15 NOTE — Progress Notes (Signed)
PROGRESS NOTE  Karen Chan    DOB: Aug 31, 1950, 71 y.o.  URK:[id number]  PCP: Buzzy Han, MD   Code Status: Full Code   DOA: 02/14/2021   LOS: 1  Brief Narrative of Current Hospitalization  Karen Chan is a 71 y.o. female with a PMH significant for HTN. They presented from home to the ED on 02/14/2021 with confusion and dizziness x7 days. In the ED, it was found that they had acute stroke on head CT and MRI. She was noted to have systolic blood pressures in 831-517 systolics. Neurology was consulted for further workup. They were treated with risk modification and BP management.  Patient was admitted to medicine service for further workup and management of acute on chronic CVA as outlined in detail below.  02/15/21 -stable  Assessment & Plan  Principal Problem:   Hypertensive urgency Active Problems:   Generalized weakness   Abnormal CT of the head   Acute confusion   Sinusitis  Acute CVA- patient endorses still feeling "foggy" with concentration, speech and eating but otherwise feels well.  - management per neurology, appreciate your care - further evaluation includes head and neck CTA, TTE, carotid US - BP, lipid, glucose management - PT/OT/SLP   Hypertensive emergency- end organ damage of cerebral tissue - restarting antihypertensives to titrate BP to goal of <140/90 - provided education/counseling to patient and family   Sinusitis - Decongestants, Flonase nasal spray, symptomatic   DVT prophylaxis: lovenox  Diet:  Diet Orders (From admission, onward)     Start     Ordered   02/15/21 0147  Diet Heart Room service appropriate? Yes; Fluid consistency: Thin  Diet effective now       Question Answer Comment  Room service appropriate? Yes   Fluid consistency: Thin      02/15/21 0147            Subjective 02/15/21    Pt reports feeling "foggy" in thinking but overall doing well.   Disposition Plan & Communication  Patient status:  Inpatient  Admitted From: Home Disposition: Home Anticipated discharge date: 1/12, pending BP control  Family Communication: husband and daughter at bedside  Consults, Procedures, Significant Events  Consultants:  neurology  Procedures/significant events:  Brain MRI  Antimicrobials:  Anti-infectives (From admission, onward)    Start     Dose/Rate Route Frequency Ordered Stop   02/14/21 2215  ampicillin-sulbactam (UNASYN) 1.5 g in sodium chloride 0.9 % 100 mL IVPB        1.5 g 200 mL/hr over 30 Minutes Intravenous  Once 02/14/21 2205 02/15/21 0148       Objective   Vitals:   02/14/21 1403 02/14/21 2152 02/15/21 0058 02/15/21 0442  BP: (!) 192/109 (!) 208/97 (!) 217/98 109/71  Pulse: 86 84 94 94  Resp: 16 17 18    Temp: 98.4 F (36.9 C) 98.2 F (36.8 C) 98.2 F (36.8 C) 97.9 F (36.6 C)  TempSrc: Oral Oral  Oral  SpO2: 95% 98% 100% 98%  Weight:   113.1 kg   Height:   5\' 1"  (1.549 m)     Intake/Output Summary (Last 24 hours) at 02/15/2021 0706 Last data filed at 02/15/2021 0300 Gross per 24 hour  Intake 200 ml  Output --  Net 200 ml   Filed Weights   02/15/21 0058  Weight: 113.1 kg    Patient BMI: Body mass index is 47.12 kg/m.   Physical Exam: General: awake, alert, NAD HEENT: atraumatic, clear conjunctiva, anicteric  sclera, MMM, hearing grossly normal Respiratory: normal respiratory effort. Cardiovascular: normal S1/S2, RRR, no JVD, murmurs, quick capillary refill  Gastrointestinal: soft, NT, ND Nervous: A&O x3. no gross focal neurologic deficits, normal speech Extremities: moves all equally, no edema, normal tone Skin: dry, intact, normal temperature, normal color. No rashes, lesions or ulcers on exposed skin Psychiatry: normal mood, congruent affect  Labs   I have personally reviewed following labs and imaging studies Admission on 02/14/2021  Component Date Value Ref Range Status   Sodium 02/14/2021 138  135 - 145 mmol/L Final   Potassium 02/14/2021  3.6  3.5 - 5.1 mmol/L Final   Chloride 02/14/2021 101  98 - 111 mmol/L Final   CO2 02/14/2021 28  22 - 32 mmol/L Final   Glucose, Bld 02/14/2021 112 (H)  70 - 99 mg/dL Final   BUN 02/14/2021 21  8 - 23 mg/dL Final   Creatinine, Ser 02/14/2021 0.96  0.44 - 1.00 mg/dL Final   Calcium 02/14/2021 9.3  8.9 - 10.3 mg/dL Final   GFR, Estimated 02/14/2021 >60  >60 mL/min Final   Anion gap 02/14/2021 9  5 - 15 Final   WBC 02/14/2021 7.9  4.0 - 10.5 K/uL Final   RBC 02/14/2021 4.14  3.87 - 5.11 MIL/uL Final   Hemoglobin 02/14/2021 13.0  12.0 - 15.0 g/dL Final   HCT 02/14/2021 40.4  36.0 - 46.0 % Final   MCV 02/14/2021 97.6  80.0 - 100.0 fL Final   MCH 02/14/2021 31.4  26.0 - 34.0 pg Final   MCHC 02/14/2021 32.2  30.0 - 36.0 g/dL Final   RDW 02/14/2021 13.4  11.5 - 15.5 % Final   Platelets 02/14/2021 276  150 - 400 K/uL Final   nRBC 02/14/2021 0.0  0.0 - 0.2 % Final   SARS Coronavirus 2 by RT PCR 02/14/2021 NEGATIVE  NEGATIVE Final   Influenza A by PCR 02/14/2021 NEGATIVE  NEGATIVE Final   Influenza B by PCR 02/14/2021 NEGATIVE  NEGATIVE Final   WBC 02/14/2021 7.7  4.0 - 10.5 K/uL Final   RBC 02/14/2021 4.51  3.87 - 5.11 MIL/uL Final   Hemoglobin 02/14/2021 14.1  12.0 - 15.0 g/dL Final   HCT 02/14/2021 44.2  36.0 - 46.0 % Final   MCV 02/14/2021 98.0  80.0 - 100.0 fL Final   MCH 02/14/2021 31.3  26.0 - 34.0 pg Final   MCHC 02/14/2021 31.9  30.0 - 36.0 g/dL Final   RDW 02/14/2021 13.5  11.5 - 15.5 % Final   Platelets 02/14/2021 265  150 - 400 K/uL Final   nRBC 02/14/2021 0.0  0.0 - 0.2 % Final   Creatinine, Ser 02/14/2021 0.82  0.44 - 1.00 mg/dL Final   GFR, Estimated 02/14/2021 >60  >60 mL/min Final    Imaging Studies  CT HEAD WO CONTRAST (5MM)  Result Date: 02/14/2021 CLINICAL DATA:  Altered mental status and weakness EXAM: CT HEAD WITHOUT CONTRAST TECHNIQUE: Contiguous axial images were obtained from the base of the skull through the vertex without intravenous contrast. COMPARISON:  None.  FINDINGS: Brain: Hypodensity in the left occipital lobe (series 3, image 15). No acute hemorrhage, mass, mass effect, or midline shift. Lacunar infarcts in the bilateral basal ganglia and left thalamus. No hydrocephalus or extra-axial collection. Vascular: No hyperdense vessel. Skull: Normal. Negative for fracture or focal lesion. Sinuses/Orbits: Complete opacification of the bilateral maxillary sinuses, with partial opacification of the ethmoid air cells, left frontal sinus, and left sphenoid sinus, which demonstrates an air-fluid  level. The orbits are unremarkable. Other: Fluid in the left-greater-than-right mastoid air cells. IMPRESSION: 1. Hypodensity in the left occipital lobe, concerning for acute infarct. 2. Air-fluid level in left sphenoid sinus with opacification of most of the and sinuses, concerning for sinusitis. Correlate with symptoms. These results were called by telephone at the time of interpretation on 02/14/2021 at 2:20 pm to provider [facility] , who verbally acknowledged these results. Electronically Signed   By: Merilyn Baba M.D.   On: 02/14/2021 14:20   MR BRAIN WO CONTRAST  Result Date: 02/15/2021 CLINICAL DATA:  Initial evaluation for acute stroke, neuro deficit. EXAM: MRI HEAD WITHOUT CONTRAST TECHNIQUE: Multiplanar, multiecho pulse sequences of the brain and surrounding structures were obtained without intravenous contrast. COMPARISON:  Prior head CT from earlier the same day. FINDINGS: Brain: Cerebral volume within normal limits. Extensive patchy and confluent T2/FLAIR hyperintensity involving the periventricular deep white matter both cerebral hemispheres, most consistent with chronic microvascular ischemic disease, fairly advanced in nature. Mild patchy involvement of the pons noted. Multiple scattered superimposed remote lacunar infarcts present about the hemispheric cerebral white matter, deep gray nuclei, and pons. Confluent restricted diffusion involving the parasagittal left  temporal occipital region, consistent with an acute to early subacute left PCA distribution infarct. Patchy involvement of the left thalamus and left hippocampal formation. Mild petechial hemorrhage without frank hemorrhagic transformation or significant mass effect. No other evidence for acute or subacute infarct. Gray-white matter differentiation otherwise maintained. No other areas of chronic cortical infarction. No acute intracranial hemorrhage. Innumerable scattered chronic micro hemorrhages noted involving both cerebral hemispheres and cerebellum, nonspecific, and could be related to chronic poorly controlled hypertension or possibly cerebral amyloid angiopathy. No mass lesion, midline shift or mass effect. No hydrocephalus or extra-axial fluid collection. Pituitary gland suprasellar region within normal limits. Midline structures intact. Vascular: Major intracranial vascular flow voids are grossly maintained. Skull and upper cervical spine: Craniocervical junction within normal limits. Bone marrow signal intensity diffusely decreased on T1 weighted sequence, nonspecific, but most commonly related to anemia, smoking or obesity. No focal marrow replacing lesion. No scalp soft tissue abnormality. Sinuses/Orbits: Globes and orbital soft tissues demonstrate no acute finding. Chronic pan sinusitis noted. Superimposed air-fluid level within the left sphenoid sinus suggest acute on chronic disease. Left mastoid and middle ear effusion noted. Visualized nasopharynx unremarkable. Other: None. IMPRESSION: 1. Acute to early subacute left PCA distribution infarct as above. Mild petechial hemorrhage without frank hemorrhagic transformation or significant mass effect. 2. Underlying advanced chronic microvascular ischemic disease with multiple remote lacunar infarcts involving the hemispheric cerebral white matter, deep gray nuclei, and pons. 3. Innumerable scattered chronic micro hemorrhages involving both cerebral  hemispheres and cerebellum, nonspecific, but could be related to chronic poorly controlled hypertension or possibly cerebral amyloid angiopathy. 4. Chronic pan sinusitis with left mastoid and middle ear effusion. Electronically Signed   By: Jeannine Boga M.D.   On: 02/15/2021 00:02    Medications   Scheduled Meds:   stroke: mapping our early stages of recovery book   Does not apply Once   enoxaparin (LOVENOX) injection  0.5 mg/kg Subcutaneous Q24H   No recently discontinued medications to reconcile  LOS: 1 day   Richarda Osmond, DO [facility] 02/15/2021, 7:06 AM   Available by Epic secure chat 7AM-7PM. If 7PM-7AM, please contact night-coverage Refer to [email or web address] to contact the [facility] Attending or Consulting provider for this pt
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
## 2021-02-16 ENCOUNTER — Inpatient Hospital Stay
Admit: 2021-02-16 | Discharge: 2021-02-16 | Disposition: A | Discharge status: Home / Self Care | Payer: Medicare HMO | Attending: Internal Medicine | Admitting: Internal Medicine

## 2021-02-16 DIAGNOSIS — Z9114 Patient's other noncompliance with medication regimen: Secondary | ICD-10-CM | POA: Diagnosis not present

## 2021-02-16 DIAGNOSIS — E7849 Other hyperlipidemia: Secondary | ICD-10-CM

## 2021-02-16 DIAGNOSIS — I63532 Cerebral infarction due to unspecified occlusion or stenosis of left posterior cerebral artery: Secondary | ICD-10-CM | POA: Diagnosis not present

## 2021-02-16 DIAGNOSIS — I161 Hypertensive emergency: Secondary | ICD-10-CM | POA: Diagnosis not present

## 2021-02-16 DIAGNOSIS — I10 Essential (primary) hypertension: Secondary | ICD-10-CM | POA: Diagnosis not present

## 2021-02-16 DIAGNOSIS — G459 Transient cerebral ischemic attack, unspecified: Secondary | ICD-10-CM | POA: Diagnosis not present

## 2021-02-16 LAB — ECHOCARDIOGRAM COMPLETE
AR max vel: 1.77 cm2
AV Area VTI: 1.86 cm2
AV Area mean vel: 1.9 cm2
AV Mean grad: 5 mmHg
AV Peak grad: 8.5 mmHg
Ao pk vel: 1.46 m/s
Area-P 1/2: 9.14 cm2
Height: 61 in
MV VTI: 2.96 cm2
S' Lateral: 2.78 cm
Weight: 3990.4 oz

## 2021-02-16 MED ORDER — ATORVASTATIN CALCIUM 20 MG PO TABS
40.0000 mg | ORAL_TABLET | Freq: Every day | ORAL | Status: DC
  Administered 2021-02-17 – 2021-02-18 (×2): 40 mg via ORAL
  Filled 2021-02-16 (×2): qty 2

## 2021-02-16 MED ORDER — HYDROCHLOROTHIAZIDE 25 MG PO TABS
25.0000 mg | ORAL_TABLET | Freq: Every day | ORAL | Status: DC
  Administered 2021-02-16 – 2021-02-18 (×3): 25 mg via ORAL
  Filled 2021-02-16 (×3): qty 1

## 2021-02-16 MED ORDER — IRBESARTAN 150 MG PO TABS: 150.0000 mg | ORAL_TABLET | Freq: Every day | ORAL | Status: DC

## 2021-02-16 MED ORDER — PERFLUTREN LIPID MICROSPHERE
1.0000 mL | INTRAVENOUS | Status: AC | PRN
  Administered 2021-02-16: 2 mL via INTRAVENOUS
  Filled 2021-02-16: qty 10

## 2021-02-16 MED ORDER — IRBESARTAN 150 MG PO TABS
75.0000 mg | ORAL_TABLET | Freq: Once | ORAL | Status: AC
  Administered 2021-02-16: 13:00:00 75 mg via ORAL
  Filled 2021-02-16: qty 1

## 2021-02-16 MED ORDER — LABETALOL HCL 5 MG/ML IV SOLN
5.0000 mg | INTRAVENOUS | Status: DC | PRN
  Administered 2021-02-16 – 2021-02-17 (×3): 5 mg via INTRAVENOUS
  Filled 2021-02-16 (×3): qty 4

## 2021-02-16 NOTE — Assessment & Plan Note (Signed)
Suspect due to CVA, cerebral hypoperfusion. Pt has some short term memory issues that, per daughter, have worsened recently. --Delirium precautions --Stroke mgmt as outlined

## 2021-02-16 NOTE — Progress Notes (Addendum)
Neurology Progress Note Timberlynn Kizziah MR# [id number] 02/16/2021   S: no overnight events; no new complaints.  O: Current vital signs: BP (!) 182/101 (BP Location: Left Arm)    Pulse 95    Temp 98.4 F (36.9 C) (Oral)    Resp 18    Ht 5\' 1"  (1.549 m)    Wt 113.1 kg    SpO2 99%    BMI 47.12 kg/m  Vital signs in last 24 hours: Temp:  [98.4 F (36.9 C)-98.7 F (37.1 C)] 98.4 F (36.9 C) (01/11 0830) Pulse Rate:  [95-117] 95 (01/11 0830) Resp:  [16-20] 18 (01/11 0830) BP: (151-191)/(72-101) 182/101 (01/11 0830) SpO2:  [95 %-99 %] 99 % (01/11 0830) GENERAL: Awake, alert in NAD HEENT: Normocephalic and atraumatic, moist mm, no LN++, no thyromegaly LUNGS: symmetric excursions bilaterally with no audible wheezes. CV: RR, equal pulses bilaterally. ABDOMEN: Soft, nontender, nondistended with normoactive BS Ext: warm, well perfused, intact peripheral pulses  NEURO:  Mental Status: AA&Ox3  Language: speech is fluent.  Intact naming, repetition, and comprehension. PERR. EOMI, visual fields full, no facial asymmetry, facial sensation intact, hearing intact. No evidence of tongue atrophy or fibrillations, tongue/uvula/soft palate midline elevates symmetrically  Normal sternocleidomastoid and trapezius muscle strength. Motor:  strength in all extremities. Tone: Tone and bulk is normal Sensation: Intact to light touch bilaterally Coordination: FTN intact bilaterally, no ataxia in BLE. Gait - Deferred  NIHSS 0  Labs LDL 129 A1c 6.1   Imaging I have reviewed images in epic and the results pertinent to this consultation are:  MRI brain showed acute-early subacute left PCA infarction, mild petechial hemorrhage without frank hemorrhagic transformation or significant mass effect. Underlying advanced chronic microvascular ischemic disease with multiple remote lacunar infarcts involving the hemispheric cerebral white matter, deep gray nuclei, and pons. Innumerable scattered chronic micro  hemorrhages involving both cerebral hemispheres and cerebellum, nonspecific, but could be related to chronic poorly controlled hypertension or possibly cerebral amyloid angiopathy.  CTA head and neck showed Severe stenosis and occlusion proximally in the left P2 segment, with some reconstitution distally in the P3 segment. No other hemodynamically significant stenosis intracranially  or in the neck .  Echocardiogram read as EF 55-60%, no wall motion abnormalities, no PFO or LVT.  Assessment: Karen Chan is a 71 y.o. female PMHx PMHx HTN, mild chronic left lower extremity weakness with acute encephalopathy in hypertensive emergency and MRI brain showing acute-early subacute left PCA embolic stroke mild petechial hemorrhage. MRI also showed scattered multifocal chronic strokes with confluent areas of microangiopathic white matter changes. Also noted were multiple cerebral microbleeds.  She states that she has HTN and is prescribed several medications however she does not take them. At home her SBPs routinely run in the 170-200s and she will need better medication adherence. Impressed upon her the importance of controlling her blood pressure by taking her antihypertensives as directed.  Cerebral microbleeds in lobar, in deep brain stem and thalami without macrobleeds in setting of uncontrolled hypertension suggests likely mixed cerebral microbleeds being more likely than cerebral amyloid angiopathy. Discussed risk and benefits of aspirin for secondary stroke prevention in this setting and benefit outweighs risk.    Recommendations: - TTE - Read pending - LDL 129 not at goal <70 she will need to start statin. - Continue aspirin 81mg  daily. - SBP goal <140/90. - stroke workup is complete. - Neurology will remain available, please call for questions.   Electronically signed by:  Lynnae Sandhoff, MD Page: [phone]  02/16/2021, 9:11 AM  If 7pm- 7am, please page neurology on call as listed in  [place].

## 2021-02-16 NOTE — TOC Progression Note (Signed)
Transition of Care [facility]) - Progression Note    Patient Details  Name: Karen Chan MRN: [id number] Date of Birth: 07/16/1950  [facility]) CM/SW [place], RN Phone Number: 02/16/2021, 2:07 PM  Clinical Narrative:   Patient's daughter requested home health to manage blood pressure and medications at home.  [place] from [facility] accepted patient.    Therapy still recommends outpatient therapy at this time.    DME delivered to patient's bedside.  TOC to follow.    Expected Discharge Plan: Home/Self Care (outpatient physical therapy) Barriers to Discharge: Continued Medical Work up  Expected Discharge Plan and Services Expected Discharge Plan: Home/Self Care (outpatient physical therapy)   Discharge Planning Services: CM Consult Post Acute Care Choice: Durable Medical Equipment (outpatient physical therapy) Living arrangements for the past 2 months: Single Family Home                 DME Arranged: 3-N-1 DME Agency: [employer] Date DME Agency Contacted: 02/15/21   Representative spoke with at DME Agency: Suanne Marker             Social Determinants of Health ([place]) Interventions    Readmission Risk Interventions No flowsheet data found.

## 2021-02-16 NOTE — Assessment & Plan Note (Addendum)
Patient found to have acute/subacute left PCA stroke.  She continues to have issues with short-term memory and "foggy" concentration.  Speech and swallow are doing well and improved. --Management per neurology, appreciate recommendations -- Echo with EF 37-29%, Grade 1 diastolic dysfunction -- BP control with goal 140/90 or less -- PT and OT recommend outpatient therapy -- SLP eval, outpatient SLP recommended (see SLP note of 1/11 for Cognistat results which shows moderate and severe deficits in cognition --Continue aspirin --Start statin, goal LDL less than 70 -- Telemetry monitoring for arrhythmias

## 2021-02-16 NOTE — Progress Notes (Signed)
Occupational Therapy Treatment Patient Details Name: Karen Chan MRN: [id number] DOB: [birth year] Today's Date: 02/16/2021   History of present illness Patient is a 71 year old female with medical history significant for HTN who was brought in by her family with a 1 week history of mild confusion. Acute to early subacute left PCA distribution infarct on report of brain MRI.   OT comments  Ms Dupriest was seen for OT treatment on this date. Upon arrival to room pt seated in chair, agreeable to tx. Pt completed seated card sorting activity focused on sorting by suit. Pt initially required MOD cues to identify correct suit improving to no cues with repetition. Pt requires CGA + HHA for toilet t/f. SUPERVISION + cues hand washing standing sink side - cues to scan R side for soap/towels. SUPERVISION + RW for navigation task in hallway - pt requires MIN cues to locate rooms and read signs correctly. Pt continues to present with impaired R peripheral vision requiring head turns and cues for scanning.   Family and pt instructed in HEP, stroke education, DME recs, and falls prevention. Pt making good progress toward goals. Pt continues to benefit from skilled OT services to maximize return to PLOF and minimize risk of future falls, injury, caregiver burden, and readmission. Will continue to follow POC. Discharge recommendation remains appropriate.     Recommendations for follow up therapy are one component of a multi-disciplinary discharge planning process, led by the attending physician.  Recommendations may be updated based on patient status, additional functional criteria and insurance authorization.    Follow Up Recommendations  Outpatient OT    Assistance Recommended at Discharge Intermittent Supervision/Assistance  Patient can return home with the following  A little help with walking and/or transfers;A little help with bathing/dressing/bathroom;Assistance with cooking/housework;Direct  supervision/assist for medications management;Assist for transportation   Equipment Recommendations  BSC/3in1    Recommendations for Other Services      Precautions / Restrictions Precautions Precautions: Fall Restrictions Weight Bearing Restrictions: No       Mobility Bed Mobility Overal bed mobility: Independent                  Transfers Overall transfer level: Needs assistance Equipment used: None Transfers: Sit to/from Stand Sit to Stand: Supervision                 Balance Overall balance assessment: Needs assistance Sitting-balance support: No upper extremity supported Sitting balance-Leahy Scale: Good     Standing balance support: During functional activity;No upper extremity supported Standing balance-Leahy Scale: Fair                             ADL either performed or assessed with clinical judgement   ADL Overall ADL's : Needs assistance/impaired                                       General ADL Comments: CGA + HHA for toilet t/f. SUPERVISION + cues hand washing standing sink side - cues to scan R side for soap/towels. SUPERVISION + RW for navigation task in hallway - pt requires MIN cues to locate rooms and read signs correctly.     Vision   Additional Comments: continues to demonstrate R peripheral vision deficits          Cognition Arousal/Alertness: Awake/alert Behavior During Therapy: [facility] for tasks  assessed/performed Overall Cognitive Status: Impaired/Different from baseline Area of Impairment: Problem solving;Safety/judgement;Memory                     Memory: Decreased short-term memory   Safety/Judgement: Decreased awareness of safety;Decreased awareness of deficits   Problem Solving: Slow processing   Functional Status Assessment: Patient has had a recent decline in their functional status and demonstrates the ability to make significant improvements in function in a reasonable and  predictable amount of time.        Exercises Exercises: Other exercises Other Exercises Other Exercises: Pt and family educated re: DME recs, d/c recs, stroke education, HEP Other Exercises: card sorting activity           Pertinent Vitals/ Pain       Pain Assessment: No/denies pain   Frequency  Min 3X/week        Progress Toward Goals  OT Goals(current goals can now be found in the care plan section)  Progress towards OT goals: Progressing toward goals  Acute Rehab OT Goals Patient Stated Goal: to go home OT Goal Formulation: With patient/family Time For Goal Achievement: 03/01/21 Potential to Achieve Goals: Good ADL Goals Pt Will Transfer to Toilet: Independently;ambulating;regular height toilet Additional ADL Goal #1: Pt will verbalize plan to implement x3 visual scanning strategies Additional ADL Goal #2: Pt will verbalize plan to implement x3 falls prevention strategies  Plan Discharge plan remains appropriate;Frequency remains appropriate    Co-evaluation                 AM-PAC OT "6 Clicks" Daily Activity     Outcome Measure   Help from another person eating meals?: None Help from another person taking care of personal grooming?: A Little Help from another person toileting, which includes using toliet, bedpan, or urinal?: A Little Help from another person bathing (including washing, rinsing, drying)?: A Little Help from another person to put on and taking off regular upper body clothing?: None Help from another person to put on and taking off regular lower body clothing?: A Little 6 Click Score: 20    End of Session    OT Visit Diagnosis: Other abnormalities of gait and mobility (R26.89)   Activity Tolerance Patient tolerated treatment well   Patient Left in bed;with call bell/phone within reach;with family/visitor present   Nurse Communication          Time: 5643-3295 OT Time Calculation (min): 32 min  Charges: OT General  Charges $OT Visit: 1 Visit OT Treatments $Self Care/Home Management : 8-22 mins $Therapeutic Activity: 8-22 mins  Dessie Coma, M.S. OTR/L  02/16/21, 1:41 PM  ascom [phone]

## 2021-02-16 NOTE — Assessment & Plan Note (Addendum)
Resolved.  Presented with uncontrolled blood pressure due to medication noncompliance and acute CVA as a result.  BP remains uncontrolled. --Goal BP < 140/90 -- Continue Coreg CR 20 mg daily -- Continue  HCTZ 25 mg daily -- Continue Avapro 300 mg daily -- Add amlodipine 5 mg daily -- Now OFF lisinopril and metoprolol  Close PCP follow up  Home BP monitoring for at least a few weeks until controlled and stable  Home health RN arranged to assist patient with BP management

## 2021-02-16 NOTE — Assessment & Plan Note (Addendum)
Supportive care with Flonase, decongestants as needed.   Improved.

## 2021-02-16 NOTE — Evaluation (Signed)
Speech Language Pathology Evaluation Patient Details Name: Karen Chan MRN: [id number] DOB: 06/03/1950 Today's Date: 02/16/2021 Time: 4098-1191 SLP Time Calculation (min) (ACUTE ONLY): 40 min  Problem List:  Patient Active Problem List   Diagnosis Date Noted   Cerebrovascular accident (CVA) [facility])    Hypertensive emergency    Generalized weakness 02/14/2021   Hypertensive urgency 02/14/2021   Abnormal CT of the head 02/14/2021   Acute confusion 02/14/2021   Sinusitis 02/14/2021   Past Medical History: History reviewed. No pertinent past medical history. Past Surgical History: History reviewed. No pertinent surgical history. HPI:  Karen Chan is a 71 y.o. female with a PMH significant for HTN.  They presented from home to the ED on 02/14/2021 with confusion and dizziness x7 days. In the ED, it was found that they had acute stroke on head CT and MRI. She was noted to have systolic blood pressures in 478-295 systolics. MRI revealed left PCA territory infarct, with increased hypodensity in the left occipital lobe, hippocampus, and lateral thalamus. Mild petechial hemorrhage without evidence of hemorrhagic transformation.   Assessment / Plan / Recommendation Clinical Impression  Pt presents with moderate to severe cognitive impairments that is c/b by severe memory deficits, moderate deficits in selective attention, insight, reasoning, complex problem solving, judgement. This results in decreased recall of orientation information, new information as well as recall of safety precautions and medicines. Pt's cognitive impairmentes are further complicated by right visual field deficits/inattention.   The Cognistat was administered to objectively measure pt's cognitive abilities.  The Cognistat is a domain specific test instrument for patients ages 4 to 63, that provides individual scores in the five major cognitive domains:  language, spatial skills, memory, calculations and  reasoning. Scores range from Average to Severe.   COGNITIVE STATUS PROFILE   Level of Consciousness: Alert   Orientation: SEVERE   Attention:  MODERATE  Language:  Average   Constructions: SEVERE   Memory: SEVERE  Calculations: Average   Reasoning: MODERATE  Education was provided to pt and her family on the results of this assessment with recommendation of Outpatient ST services.      SLP Assessment  SLP Recommendation/Assessment: All further Speech Lanaguage Pathology  needs can be addressed in the next venue of care SLP Visit Diagnosis: Cognitive communication deficit (R41.841)    Recommendations for follow up therapy are one component of a multi-disciplinary discharge planning process, led by the attending physician.  Recommendations may be updated based on patient status, additional functional criteria and insurance authorization.    Follow Up Recommendations  Outpatient SLP    Assistance Recommended at Discharge  Frequent or constant Supervision/Assistance  Functional Status Assessment Patient has had a recent decline in their functional status and demonstrates the ability to make significant improvements in function in a reasonable and predictable amount of time.  Frequency and Duration   N/A        SLP Evaluation Cognition  Overall Cognitive Status: Impaired/Different from baseline Arousal/Alertness: Awake/alert Orientation Level: Oriented to person;Oriented to situation Year: 2022 Month: June Day of Week: Incorrect Attention: Sustained;Selective Sustained Attention: Appears intact Selective Attention: Impaired Selective Attention Impairment: Verbal complex;Functional complex Memory: Impaired Memory Impairment: Retrieval deficit;Decreased recall of new information;Decreased short term memory Decreased Short Term Memory: Verbal basic;Functional basic Awareness: Impaired Awareness Impairment: Intellectual impairment;Emergent impairment Problem Solving:  Impaired Problem Solving Impairment: Verbal complex;Functional complex Executive Function:  (all impaired by memory deficits) Safety/Judgment: Impaired       Comprehension  Auditory Comprehension Overall  Auditory Comprehension: Appears within functional limits for tasks assessed Reading Comprehension Reading Status: Not tested    Expression Expression Primary Mode of Expression: Verbal Verbal Expression Overall Verbal Expression: Appears within functional limits for tasks assessed Written Expression Dominant Hand: Right Written Expression: Not tested   Oral / Motor  Oral Motor/Sensory Function Overall Oral Motor/Sensory Function: Within functional limits Motor Speech Overall Motor Speech: Appears within functional limits for tasks assessed           Marly Schuld B. Rutherford Nail M.S., CCC-SLP, [place] Office [phone]  Stormy Fabian 02/16/2021, 12:48 PM

## 2021-02-16 NOTE — Assessment & Plan Note (Signed)
PT and OT evaluated and recommend outpatient therapy after discharge.  Fall precautions

## 2021-02-16 NOTE — Plan of Care (Signed)
°  Problem: Education: Goal: Knowledge of General Education information will improve Description: Including pain rating scale, medication(s)/side effects and non-pharmacologic comfort measures Outcome: Progressing   Problem: Health Behavior/Discharge Planning: Goal: Ability to manage health-related needs will improve Outcome: Progressing   Problem: Clinical Measurements: Goal: Ability to maintain clinical measurements within normal limits will improve Outcome: Progressing Goal: Will remain free from infection Outcome: Progressing Goal: Diagnostic test results will improve Outcome: Progressing Goal: Respiratory complications will improve Outcome: Progressing Goal: Cardiovascular complication will be avoided Outcome: Progressing   Problem: Activity: Goal: Risk for activity intolerance will decrease Outcome: Progressing   Problem: Nutrition: Goal: Adequate nutrition will be maintained Outcome: Progressing   Problem: Coping: Goal: Level of anxiety will decrease Outcome: Progressing   Problem: Elimination: Goal: Will not experience complications related to bowel motility Outcome: Progressing Goal: Will not experience complications related to urinary retention Outcome: Progressing   Problem: Pain Managment: Goal: General experience of comfort will improve Outcome: Progressing   Problem: Safety: Goal: Ability to remain free from injury will improve Outcome: Progressing   Problem: Skin Integrity: Goal: Risk for impaired skin integrity will decrease Outcome: Progressing   Problem: Education: Goal: Knowledge of secondary prevention will improve (SELECT ALL) Outcome: Progressing   

## 2021-02-16 NOTE — Progress Notes (Signed)
*  PRELIMINARY RESULTS* Echocardiogram 2D Echocardiogram has been performed.  Karen Chan 02/16/2021, 9:00 AM

## 2021-02-16 NOTE — Assessment & Plan Note (Signed)
Subsequent evaluation revealed stroke.   Mgmt as outlined.

## 2021-02-16 NOTE — Progress Notes (Addendum)
Physical Therapy Treatment Patient Details Name: Karen Chan MRN: [id number] DOB: 1950/06/01 Today's Date: 02/16/2021   History of Present Illness Patient is a 71 year old female with medical history significant for HTN who was brought in by her family with a 1 week history of mild confusion. Acute to early subacute left PCA distribution infarct on report of brain MRI.    PT Comments    Patient tolerated session well and was agreeable to treatment. Upon arrival patient was sitting EOB talking with family present. Patient able to demonstrate sit to stands from EOB and elevated toilet at supervision. Ambulating around the room and in the hallway, patient completed without an AD at SBA, however required intermittent tactile cueing from railings and furniture for stability. Patient demonstrates mild impairment with obstacle navigation, running into a wall and door to room. She continues to demonstrate increased time with motor planning and processing with functional activity. Therapeutic activity EOB focused on BLE strength. Patient continues to appear not at her baseline PLOF, and would continue to benefit from skilled physical therapy in order to optimize return to PLOF. Continue to recommend outpatient PT following d/c from acute hospitalization.   Recommendations for follow up therapy are one component of a multi-disciplinary discharge planning process, led by the attending physician.  Recommendations may be updated based on patient status, additional functional criteria and insurance authorization.  Follow Up Recommendations  Outpatient PT     Assistance Recommended at Discharge Set up Supervision/Assistance  Patient can return home with the following Assist for transportation;Direct supervision/assist for medications management   Equipment Recommendations  Rolling walker (2 wheels)    Recommendations for Other Services       Precautions / Restrictions Precautions Precautions:  Fall Restrictions Weight Bearing Restrictions: No     Mobility  Bed Mobility Overal bed mobility: Independent             General bed mobility comments: no physical assistance required for bed mobility    Transfers Overall transfer level: Needs assistance Equipment used: None Transfers: Sit to/from Stand Sit to Stand: Supervision           General transfer comment: supervision for safety    Ambulation/Gait Ambulation/Gait assistance: Min guard;Supervision Gait Distance (Feet): 150 Feet   Gait Pattern/deviations: Step-through pattern;Decreased stride length;Narrow base of support Gait velocity: decreased     General Gait Details: Patient ambulated SBA w/o AD within room and out in hallway with intermittent tactile cueing from hallway rail, foot of the bed, or other furniture for stability. Patient was able to navigate her way back to her room with verbal cues. Patient was able to follow commands such as stop and turn, and was able to verbalize fatigue/ when she needed to turn back around and head back to room. Continues to have increased time with processing/ motor planning. Due to visual deficits patient has 2 instances where she ran into wall or door. HR ranged from 103-132bpm during ambulation. With standing rest break when out in hallways and at conclusion of activity sitting EOB, HR was able to return to 103bpm.    Stairs             Wheelchair Mobility    Modified Rankin (Stroke Patients Only)       Balance Overall balance assessment: Needs assistance Sitting-balance support: No upper extremity supported Sitting balance-Leahy Scale: Good     Standing balance support: During functional activity;No upper extremity supported Standing balance-Leahy Scale: Fair Standing balance comment: reaching  for UE support during mobility to stabilize                            Cognition Arousal/Alertness: Awake/alert Behavior During Therapy: WFL for  tasks assessed/performed Overall Cognitive Status: Impaired/Different from baseline Area of Impairment: Problem solving;Safety/judgement;Memory                     Memory: Decreased short-term memory   Safety/Judgement: Decreased awareness of safety;Decreased awareness of deficits   Problem Solving: Slow processing   Functional Status Assessment: Patient has had a recent decline in their functional status and demonstrates the ability to make significant improvements in function in a reasonable and predictable amount of time.      Exercises General Exercises - Lower Extremity Long Arc Quad: Seated;AROM;10 reps;Both;Strengthening Hip Flexion/Marching: Seated;AROM;20 reps;Both;Strengthening Other Exercises Other Exercises: patient able to demonstrate Mod I with bathroom use and hygiene, required SBA when ambualting EOB <>bathroom, sit to stand completed supervision for safety Other Exercises: patient and family educated on use of RW when ambulating out in the community and at home, when not utilizing RW within the home make sure someone is SBA/supervision for safety due to visual deficits    General Comments        Pertinent Vitals/Pain Pain Assessment: No/denies pain    Home Living     Available Help at Discharge: Family;Available PRN/intermittently Type of Home: Apartment                  Prior Function            PT Goals (current goals can now be found in the care plan section)      Frequency    Min 2X/week      PT Plan Current plan remains appropriate    Co-evaluation              AM-PAC PT "6 Clicks" Mobility   Outcome Measure  Help needed turning from your back to your side while in a flat bed without using bedrails?: None Help needed moving from lying on your back to sitting on the side of a flat bed without using bedrails?: A Little Help needed moving to and from a bed to a chair (including a wheelchair)?: A Little Help needed  standing up from a chair using your arms (e.g., wheelchair or bedside chair)?: A Little Help needed to walk in hospital room?: A Little Help needed climbing 3-5 steps with a railing? : A Little 6 Click Score: 19    End of Session Equipment Utilized During Treatment: Gait belt Activity Tolerance: Patient tolerated treatment well Patient left: in bed;with call bell/phone within reach;with family/visitor present Nurse Communication: Mobility status PT Visit Diagnosis: Unsteadiness on feet (R26.81);Other symptoms and signs involving the nervous system (H37.169)     Time: 1430-1446 PT Time Calculation (min) (ACUTE ONLY): 16 min  Charges:  $Therapeutic Activity: 8-22 mins                     Iva Boop, PT  02/16/21. 3:05 PM

## 2021-02-16 NOTE — Hospital Course (Addendum)
Karen Chan is a 71 y.o. female with a PMH significant for HTN (noncompliant with antihypertensives at home) who presented from home to the ED on 02/14/2021 with confusion and dizziness x7 days. In the ED, it was found that they had acute L PCA stroke on head CT and MRI. She was noted to have systolic blood pressures in 190-200's. Neurology was consulted for further workup. They were treated with risk modification and BP management.  Patient was admitted to medicine service for further workup and management of acute on chronic CVA as outlined in detail below.   02/18/21 - Added amlodipine to regimen this morning. BP's were still in systolic 101'B to 510'C yesterday and this AM.    Pt is medically stable for d/c with ongoing BP management with Home health RN and close PCP follow up.

## 2021-02-16 NOTE — Progress Notes (Signed)
Progress Note   Patient: Karen Chan QIW:[id number] DOB: 1950/08/18 DOA: 02/14/2021     2 DOS: the patient was seen and examined on 02/16/2021   Brief hospital course: Karen Chan is a 71 y.o. female with a PMH significant for HTN (noncompliant with antihypertensives at home) who presented from home to the ED on 02/14/2021 with confusion and dizziness x7 days. In the ED, it was found that they had acute L PCA stroke on head CT and MRI. She was noted to have systolic blood pressures in 190-200's. Neurology was consulted for further workup. They were treated with risk modification and BP management.  Patient was admitted to medicine service for further workup and management of acute on chronic CVA as outlined in detail below.   02/16/21 -stable but BP's remain uncontrolled.  Assessment and Plan * Hypertensive emergency- (present on admission) Presented with uncontrolled blood pressure due to medication noncompliance and acute CVA as a result.  BP remains uncontrolled. -- Titrate antihypertensives for goal BP < 140/90 -- Continue Coreg 24-hour capsule 10 mg daily -- Resume home HCTZ 25 mg daily -- Increase irbesartan to 150 mg daily -- Now off lisinopril and metoprolol -- Monitor BP closely  Cerebrovascular accident (CVA) ([place])- (present on admission) Patient found to have acute/subacute left PCA stroke.  She continues to have issues with short-term memory and "foggy" concentration.  Speech and swallow are doing well and improved. --Management per neurology, appreciate recommendations -- Follow-up pending echo -- BP control with goal 140/90 or less -- PT and OT recommend outpatient therapy -- SLP eval, outpatient SLP recommended (see SLP note of 1/11 for Cognistat results which shows moderate and severe deficits in cognition --Continue aspirin --Start statin, goal LDL less than 70 -- Telemetry monitoring for arrhythmias  Abnormal CT of the head- (present on  admission) Subsequent evaluation revealed stroke.   Mgmt as outlined.  Acute confusion- (present on admission) Suspect due to CVA, cerebral hypoperfusion. Pt has some short term memory issues that, per daughter, have worsened recently. --Delirium precautions --Stroke mgmt as outlined  Generalized weakness PT and OT evaluated and recommend outpatient therapy after discharge.  Fall precautions  Sinusitis- (present on admission) Supportive care with Flonase, decongestants as needed.  Monitor.     Subjective: Patient seen up in recliner, daughter at bedside today.  Daughter reports patient having increased issues with her memory.  Still having some mild confusion.  Patient says her word finding difficulties have resolved, however daughter has noted this to occur at times.  No other acute complaints.  She did have a headache earlier this morning which is now better.  Objective Vitals reviewed and notable for systolic BP 417E to 081K.  8:30 AM BP was 182/101.  Mild tachycardia with heart rate in the 90s to low 100s  General exam: awake, alert, no acute distress, obese, sitting up in recliner HEENT: moist mucus membranes, hearing grossly normal  Respiratory system: CTAB, no wheezes, rales or rhonchi, normal respiratory effort. Cardiovascular system: normal S1/S2, RRR, no pedal edema.   Gastrointestinal system: soft, NT, ND, no HSM felt, +bowel sounds. Central nervous system: A&O x3. no gross focal neurologic deficits, normal speech Extremities: moves all, no edema, normal tone Skin: dry, intact, normal temperature Psychiatry: normal mood, congruent affect, judgement and insight appear normal   Data Reviewed:  Labs reviewed and notable for LDL 129, total cholesterol 214, hemoglobin A1c 6.1%  Echocardiogram with EF 55 to 60%, mild LVH and grade 1 diastolic dysfunction  Bilateral carotid ultrasounds negative for hemodynamically significant stenosis  Family Communication: Daughter at  bedside on rounds today  Disposition: Status is: Inpatient  Remains inpatient appropriate because: BP remains uncontrolled.  Discharge home pending clearance by neurology and controlled BP.         Time spent: 35 minutes  Author: Ezekiel Slocumb, DO 02/16/2021 3:19 PM  For on call review [email or web address].

## 2021-02-17 ENCOUNTER — Encounter: Payer: Self-pay | Admitting: Internal Medicine

## 2021-02-17 DIAGNOSIS — I161 Hypertensive emergency: Secondary | ICD-10-CM | POA: Diagnosis not present

## 2021-02-17 LAB — BASIC METABOLIC PANEL
Anion gap: 8 (ref 5–15)
BUN: 17 mg/dL (ref 8–23)
CO2: 28 mmol/L (ref 22–32)
Calcium: 8.9 mg/dL (ref 8.9–10.3)
Chloride: 104 mmol/L (ref 98–111)
Creatinine, Ser: 0.83 mg/dL (ref 0.44–1.00)
GFR, Estimated: >60 mL/min (ref >60)
Glucose, Bld: 141 mg/dL — ABNORMAL HIGH (ref 70–99)
Potassium: 3.7 mmol/L (ref 3.5–5.1)
Sodium: 140 mmol/L (ref 135–145)

## 2021-02-17 LAB — MAGNESIUM: Magnesium: 2.2 mg/dL (ref 1.7–2.4)

## 2021-02-17 MED ORDER — CARVEDILOL PHOSPHATE ER 10 MG PO CP24
20.0000 mg | ORAL_CAPSULE | Freq: Every day | ORAL | Status: DC
  Administered 2021-02-17 – 2021-02-18 (×2): 20 mg via ORAL
  Filled 2021-02-17 (×2): qty 2

## 2021-02-17 MED ORDER — ATORVASTATIN CALCIUM 40 MG PO TABS: 40.0000 mg | ORAL_TABLET | Freq: Every day | ORAL | 2 refills | Status: DC

## 2021-02-17 MED ORDER — ONDANSETRON HCL 4 MG/2ML IJ SOLN
4.0000 mg | Freq: Once | INTRAMUSCULAR | Status: AC
  Administered 2021-02-17: 04:00:00 4 mg via INTRAVENOUS
  Filled 2021-02-17: qty 2

## 2021-02-17 MED ORDER — IRBESARTAN 150 MG PO TABS
300.0000 mg | ORAL_TABLET | Freq: Every day | ORAL | Status: DC
  Administered 2021-02-17 – 2021-02-18 (×2): 300 mg via ORAL
  Filled 2021-02-17 (×2): qty 2

## 2021-02-17 MED ORDER — HYDROCHLOROTHIAZIDE 25 MG PO TABS: 25.0000 mg | ORAL_TABLET | Freq: Every day | ORAL | 2 refills | Status: DC

## 2021-02-17 MED ORDER — IRBESARTAN 300 MG PO TABS: 300.0000 mg | ORAL_TABLET | Freq: Every day | ORAL | 2 refills | Status: DC

## 2021-02-17 MED ORDER — CARVEDILOL PHOSPHATE ER 20 MG PO CP24: 20.0000 mg | ORAL_CAPSULE | Freq: Every day | ORAL | 2 refills | Status: DC

## 2021-02-17 MED ORDER — ACETAMINOPHEN 325 MG PO TABS: 650.0000 mg | ORAL_TABLET | ORAL | Status: AC | PRN

## 2021-02-17 MED ORDER — ASPIRIN EC 81 MG PO TBEC: 81.0000 mg | DELAYED_RELEASE_TABLET | Freq: Every day | ORAL | 11 refills | Status: AC

## 2021-02-17 NOTE — Plan of Care (Signed)
°  Problem: Education: Goal: Knowledge of General Education information will improve Description: Including pain rating scale, medication(s)/side effects and non-pharmacologic comfort measures 02/17/2021 1133 by Kennidi Yoshida, Camillo Flaming, RN Outcome: Progressing 02/17/2021 1133 by Riker Collier, Camillo Flaming, RN Outcome: Progressing   Problem: Health Behavior/Discharge Planning: Goal: Ability to manage health-related needs will improve 02/17/2021 1133 by Quinterius Gaida, Camillo Flaming, RN Outcome: Progressing 02/17/2021 1133 by Daisy Blossom, RN Outcome: Progressing   Problem: Clinical Measurements: Goal: Ability to maintain clinical measurements within normal limits will improve 02/17/2021 1133 by Aoife Bold, Camillo Flaming, RN Outcome: Progressing 02/17/2021 1133 by Daisy Blossom, RN Outcome: Progressing Goal: Will remain free from infection 02/17/2021 1133 by Madalynne Gutmann, Camillo Flaming, RN Outcome: Progressing 02/17/2021 1133 by Daisy Blossom, RN Outcome: Progressing Goal: Diagnostic test results will improve 02/17/2021 1133 by Ayodeji Keimig, Camillo Flaming, RN Outcome: Progressing 02/17/2021 1133 by Daisy Blossom, RN Outcome: Progressing Goal: Respiratory complications will improve 02/17/2021 1133 by Masaki Rothbauer, Camillo Flaming, RN Outcome: Progressing 02/17/2021 1133 by Daisy Blossom, RN Outcome: Progressing Goal: Cardiovascular complication will be avoided 02/17/2021 1133 by Vasilis Luhman, Camillo Flaming, RN Outcome: Progressing 02/17/2021 1133 by Daisy Blossom, RN Outcome: Progressing   Problem: Activity: Goal: Risk for activity intolerance will decrease 02/17/2021 1133 by Kathrynn Backstrom, Camillo Flaming, RN Outcome: Progressing 02/17/2021 1133 by Daisy Blossom, RN Outcome: Progressing   Problem: Nutrition: Goal: Adequate nutrition will be maintained 02/17/2021 1133 by Regena Delucchi, Camillo Flaming, RN Outcome: Progressing 02/17/2021 1133 by Daisy Blossom, RN Outcome: Progressing   Problem: Coping: Goal: Level of anxiety will  decrease 02/17/2021 1133 by Nannette Zill, Camillo Flaming, RN Outcome: Progressing 02/17/2021 1133 by Daisy Blossom, RN Outcome: Progressing   Problem: Elimination: Goal: Will not experience complications related to bowel motility 02/17/2021 1133 by Math Brazie, Camillo Flaming, RN Outcome: Progressing 02/17/2021 1133 by Daisy Blossom, RN Outcome: Progressing Goal: Will not experience complications related to urinary retention 02/17/2021 1133 by Samya Siciliano, Camillo Flaming, RN Outcome: Progressing 02/17/2021 1133 by Daisy Blossom, RN Outcome: Progressing   Problem: Pain Managment: Goal: General experience of comfort will improve 02/17/2021 1133 by Lynnett Langlinais, Camillo Flaming, RN Outcome: Progressing 02/17/2021 1133 by Daisy Blossom, RN Outcome: Progressing   Problem: Safety: Goal: Ability to remain free from injury will improve 02/17/2021 1133 by Dyamond Tolosa, Camillo Flaming, RN Outcome: Progressing 02/17/2021 1133 by Daisy Blossom, RN Outcome: Progressing   Problem: Skin Integrity: Goal: Risk for impaired skin integrity will decrease 02/17/2021 1133 by Tylerjames Hoglund, Camillo Flaming, RN Outcome: Progressing 02/17/2021 1133 by Ilya Ess, Camillo Flaming, RN Outcome: Progressing   Problem: Education: Goal: Knowledge of secondary prevention will improve ([facility] ALL) 02/17/2021 1133 by Daisy Blossom, RN Outcome: Progressing 02/17/2021 1133 by Daisy Blossom, RN Outcome: Progressing

## 2021-02-17 NOTE — Progress Notes (Signed)
° °      CROSS COVER NOTE  NAME: Karen Chan MRN: [id number] DOB : [birth year]    Secure chat received from RN reporting patient has nausea and requesting medication.  Patient presented with acute ischemic stroke. MRI also showed mild petechial hemorrhage and chronic microhemorrhages throughout without hemorrhagic transformation.  I went to bedside and performed a neuro exam, patient is neurologically intact without any focal deficits and any evidence on exam of hemorrhagic transformation. Zofran ordered.   Neomia Glass MHA, MSN, FNP-BC Nurse Practitioner [facility] Pager [phone]

## 2021-02-17 NOTE — Plan of Care (Deleted)
  Problem: Health Behavior/Discharge Planning: Goal: Ability to manage health-related needs will improve Outcome: Progressing   Problem: Clinical Measurements: Goal: Cardiovascular complication will be avoided Outcome: Progressing   Problem: Activity: Goal: Risk for activity intolerance will decrease Outcome: Progressing   Problem: Pain Managment: Goal: General experience of comfort will improve Outcome: Progressing   Problem: Safety: Goal: Ability to remain free from injury will improve Outcome: Progressing   

## 2021-02-17 NOTE — Progress Notes (Addendum)
Pt complaints of nausea but no PRN order on Mar. NP Foust made aware. Will continue to monitor.  Update 0445: NP Foust placed order. Will continue to monitor.

## 2021-02-17 NOTE — Plan of Care (Signed)
°  Problem: Health Behavior/Discharge Planning: Goal: Ability to manage health-related needs will improve Outcome: Progressing   Problem: Clinical Measurements: Goal: Cardiovascular complication will be avoided Outcome: Progressing   Problem: Activity: Goal: Risk for activity intolerance will decrease Outcome: Progressing   Problem: Pain Managment: Goal: General experience of comfort will improve Outcome: Progressing   Problem: Safety: Goal: Ability to remain free from injury will improve Outcome: Progressing   Problem: Education: Goal: Knowledge of secondary prevention will improve (SELECT ALL) 02/17/2021 0141 by Liliane Channel, RN Outcome: Progressing 02/17/2021 0141 by Liliane Channel, RN Outcome: Progressing

## 2021-02-17 NOTE — Progress Notes (Signed)
°   02/16/21 1800  Assess: MEWS Score  Temp 98.1 F (36.7 C)  BP (!) 201/101  Pulse Rate 90  Level of Consciousness Alert  Assess: MEWS Score  MEWS Temp 0  MEWS Systolic 2  MEWS Pulse 0  MEWS RR 0  MEWS LOC 0  MEWS Score 2  MEWS Score Color Yellow  Assess: if the MEWS score is Yellow or Red  Were vital signs taken at a resting state? Yes  Focused Assessment No change from prior assessment  Does the patient meet 2 or more of the SIRS criteria? No  MEWS guidelines implemented *See Row Information* Yes  Treat  MEWS Interventions Administered prn meds/treatments  Pain Scale 0-10  Pain Score 0  Take Vital Signs  Increase Vital Sign Frequency  Yellow: Q 2hr X 2 then Q 4hr X 2, if remains yellow, continue Q 4hrs  Escalate  MEWS: Escalate Yellow: discuss with charge nurse/RN and consider discussing with provider and RRT  Notify: Charge Nurse/RN  Name of Charge Nurse/RN Notified Montine Circle RN  Date Charge Nurse/RN Notified 02/16/21  Time Charge Nurse/RN Notified 1816  Document  Patient Outcome Other (Comment) (continue to monitor)  Assess: SIRS CRITERIA  SIRS Temperature  0  SIRS Pulse 0  SIRS Respirations  0  SIRS WBC 0  SIRS Score Sum  0

## 2021-02-17 NOTE — Discharge Summary (Addendum)
Physician Discharge Summary   Patient: Karen Chan MRN: [id number] DOB: 04/06/1950  Admit date:     02/14/2021  Discharge date: 02/18/2021  Discharge Physician: Ezekiel Slocumb   PCP: Buzzy Han, MD   Recommendations at discharge:    Follow up with PCP in 1-2 weeks Check CBC/BMP in 1-2 weeks Follow up on BP control, goal BP less than 140/90 Follow up with Neurology PT, OT and SLP all recommended outpatient therapy follow up    Discharge Diagnoses Principal Problem:   Hypertensive emergency Active Problems:   Abnormal CT of the head   Cerebrovascular accident (CVA) ([place])   Acute confusion   Generalized weakness   Sinusitis   Obesity, Class III, BMI 40-49.9 (morbid obesity) ([place])  Resolved Problems:   Hypertensive urgency   1/12 6 PM Addendum - discharge delayed as BP still elevated and home health RN arrangements need to be confirmed.   Hospital Course   Karen Chan is a 71 y.o. female with a PMH significant for HTN (noncompliant with antihypertensives at home) who presented from home to the ED on 02/14/2021 with confusion and dizziness x7 days. In the ED, it was found that they had acute L PCA stroke on head CT and MRI. She was noted to have systolic blood pressures in 190-200's. Neurology was consulted for further workup. They were treated with risk modification and BP management.  Patient was admitted to medicine service for further workup and management of acute on chronic CVA as outlined in detail below.   02/18/21 - Added amlodipine to regimen this morning. BP's were still in systolic 109'N to 235'T yesterday and this AM.    Pt is medically stable for d/c with ongoing BP management with Home health RN and close PCP follow up.  * Hypertensive emergency- (present on admission) Resolved.  Presented with uncontrolled blood pressure due to medication noncompliance and acute CVA as a result.  BP remains uncontrolled. --Goal BP < 140/90 --  Continue Coreg CR 20 mg daily -- Continue  HCTZ 25 mg daily -- Continue Avapro 300 mg daily -- Add amlodipine 5 mg daily -- Now OFF lisinopril and metoprolol  Close PCP follow up  Home BP monitoring for at least a few weeks until controlled and stable  Home health RN arranged to assist patient with BP management  Cerebrovascular accident (CVA) ([place])- (present on admission) Patient found to have acute/subacute left PCA stroke.  She continues to have issues with short-term memory and "foggy" concentration.  Speech and swallow are doing well and improved. --Management per neurology, appreciate recommendations -- Echo with EF 73-22%, Grade 1 diastolic dysfunction -- BP control with goal 140/90 or less -- PT and OT recommend outpatient therapy -- SLP eval, outpatient SLP recommended (see SLP note of 1/11 for Cognistat results which shows moderate and severe deficits in cognition --Continue aspirin --Start statin, goal LDL less than 70 -- Telemetry monitoring for arrhythmias  Abnormal CT of the head- (present on admission) Subsequent evaluation revealed stroke.   Mgmt as outlined.  Acute confusion- (present on admission) Suspect due to CVA, cerebral hypoperfusion. Pt has some short term memory issues that, per daughter, have worsened recently. --Delirium precautions --Stroke mgmt as outlined  Generalized weakness PT and OT evaluated and recommend outpatient therapy after discharge.  Fall precautions  Sinusitis- (present on admission) Supportive care with Flonase, decongestants as needed.   Improved.   Morbid Obesity -  Body mass index is 47.12 kg/m. Complicates overall care and prognosis.  Recommend lifestyle modifications including physical activity and diet for weight loss and overall long-term health.        Consultants: Neurology Procedures performed: Echo EF 16-10%, grade 1 diastolic dysfunction, mild LVH   Disposition: Home with [place] RN Diet recommendation: Cardiac  diet  DISCHARGE MEDICATION: Allergies as of 02/18/2021   Not on File      Medication List     STOP taking these medications    lisinopril 40 MG tablet Commonly known as: ZESTRIL   metoprolol tartrate 50 MG tablet Commonly known as: LOPRESSOR       TAKE these medications    acetaminophen 325 MG tablet Commonly known as: TYLENOL Take 2 tablets (650 mg total) by mouth every 4 (four) hours as needed for mild pain (or temp > 37.5 C (99.5 F)).   amLODipine 5 MG tablet Commonly known as: NORVASC Take 1 tablet (5 mg total) by mouth daily. Start taking on: February 19, 2021   aspirin EC 81 MG tablet Take 1 tablet (81 mg total) by mouth daily. Swallow whole.   atorvastatin 40 MG tablet Commonly known as: LIPITOR Take 1 tablet (40 mg total) by mouth daily.   carvedilol 20 MG 24 hr capsule Commonly known as: COREG CR Take 1 capsule (20 mg total) by mouth daily.   hydrochlorothiazide 25 MG tablet Commonly known as: HYDRODIURIL Take 1 tablet (25 mg total) by mouth daily.   irbesartan 300 MG tablet Commonly known as: AVAPRO Take 1 tablet (300 mg total) by mouth daily.               Durable Medical Equipment  (From admission, onward)           Start     Ordered   02/16/21 0904  For home use only DME Walker rolling  Once       Question Answer Comment  Walker: With [facility]   Patient needs a walker to treat with the following condition Impaired ambulation      02/16/21 0904   02/16/21 0904  For home use only DME 3 n 1  Once        02/16/21 9604            Follow-up Information     Vladimir Crofts, MD. Schedule an appointment as soon as possible for a visit in 4 week(s).   Specialty: Neurology Why: Patient needs to schedule appt. Office is closed. Contact information: [facility] [place] [address] [phone]                 Discharge Exam: Filed Weights   02/15/21 0058  Weight: 113.1  kg   General exam: awake, alert, no acute distress, obese Respiratory system: CTAB, no wheezes, rales or rhonchi, normal respiratory effort. Cardiovascular system: normal S1/S2,  RRR, no JVD, murmurs, rubs, gallops, no pedal edema.   Gastrointestinal system: soft, NT, ND, no HSM felt, +bowel sounds. Central nervous system: A&O x3. no gross focal neurologic deficits, normal speech Extremities: moves all , no edema, normal tone Skin: dry, intact, normal temperature Psychiatry: normal mood, congruent affect, judgement and insight appear normal   Condition at discharge: stable  The results of significant diagnostics from this hospitalization (including imaging, microbiology, ancillary and laboratory) are listed below for reference.   Imaging Studies: CT ANGIO HEAD NECK W WO CM  Result Date: 02/15/2021 CLINICAL DATA:  Stroke follow-up EXAM: CT ANGIOGRAPHY HEAD AND NECK TECHNIQUE: Multidetector CT imaging  of the head and neck was performed using the standard protocol during bolus administration of intravenous contrast. Multiplanar CT image reconstructions and MIPs were obtained to evaluate the vascular anatomy. Carotid stenosis measurements (when applicable) are obtained utilizing NASCET criteria, using the distal internal carotid diameter as the denominator. RADIATION DOSE REDUCTION: This exam was performed according to the departmental dose-optimization program which includes automated exposure control, adjustment of the mA and/or kV according to patient size and/or use of iterative reconstruction technique. CONTRAST:  42mL OMNIPAQUE IOHEXOL 350 MG/ML SOLN COMPARISON:  CT head 02/14/2021, correlation is also made with MRI head 02/14/2021 FINDINGS: CT HEAD FINDINGS Brain: Redemonstrated hypodensity in the left medial occipital lobe, slightly increased in size, likely secondary toxic edema, with some hyperdense foci, likely petechial hemorrhage. Hypodensity extends into the left hippocampus and lateral  thalamus, consistent with known PCA territory infarct. No new area of hypodensity. No mass, mass effect, or midline shift. No other evidence of hemorrhage. No hydrocephalus or extra-axial collection. Periventricular white matter changes, likely the sequela of chronic small vessel ischemic disease. Vascular: No hyperdense vessel. Skull: Normal. Negative for fracture or focal lesion. Sinuses/Orbits: Redemonstrated extensive sinus disease, with osseous thickening in the right maxillary sinus, consistent chronic sinusitis. The orbits are unremarkable. Other: Fluid in the left-greater-than-right mastoid air cells and left middle ear. CTA NECK FINDINGS Aortic arch: Standard branching. Imaged portion shows no evidence of aneurysm or dissection. No significant stenosis of the major arch vessel origins. Right carotid system: No evidence of dissection, stenosis (50% or greater) or occlusion. Left carotid system: No evidence of dissection, stenosis (50% or greater) or occlusion. Vertebral arteries: No evidence of dissection, stenosis (50% or greater) or occlusion. 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 Flowing osteophytes anteriorly at these levels, as can be seen with diffuse idiopathic skeletal hyperostosis. Degenerative osseous fusion across C4 and C5. No acute osseous abnormality. Other neck: Negative. Upper chest: No focal pulmonary opacity. Review of the MIP images confirms the above findings CTA HEAD FINDINGS Anterior circulation: Both internal carotid arteries are patent to the termini, with moderate calcifications in the left carotid siphon but not significant stenosis. A1 segments patent, with a hypoplastic left A1. Normal anterior communicating artery. Anterior cerebral arteries are patent to their distal aspects. No M1 stenosis or occlusion. Normal MCA bifurcations. Distal MCA branches perfused and symmetric. Posterior circulation:  Vertebral arteries patent to the vertebrobasilar junction without stenosis. Posterior inferior cerebral arteries patent bilaterally. Basilar patent to its distal aspect. Superior cerebellar arteries patent bilaterally. Bilateral P1 segments originate from the basilar artery. There is severe narrowing of the left P2 segment, with only minimal if any flow seen after the proximal aspect of the left P2 (series 11, images 95-97), with some reconstitution distally in P3 (series 11, image 92). The right PCA is well perfused to its distal aspects. The bilateral posterior communicating arteries are not definitively visualized. Venous sinuses: As permitted by contrast timing, patent. Anatomic variants: None significant Review of the MIP images confirms the above findings IMPRESSION: 1. Severe stenosis and occlusion proximally in the left P2 segment, with some reconstitution distally in the P3 segment. No other intracranial hemodynamically significant stenosis. 2. No hemodynamically significant stenosis in the neck. 3. Expected evolution of the previously noted left PCA territory infarct, with increased hypodensity in the left occipital lobe, hippocampus, and lateral thalamus. Mild petechial hemorrhage without evidence of hemorrhagic transformation. These results will be called to the ordering clinician or  representative by the Radiologist Assistant, and communication documented in the PACS or [employer]. Electronically Signed   By: Merilyn Baba M.D.   On: 02/15/2021 16:13   CT HEAD WO CONTRAST (5MM)  Result Date: 02/14/2021 CLINICAL DATA:  Altered mental status and weakness EXAM: CT HEAD WITHOUT CONTRAST TECHNIQUE: Contiguous axial images were obtained from the base of the skull through the vertex without intravenous contrast. COMPARISON:  None. FINDINGS: Brain: Hypodensity in the left occipital lobe (series 3, image 15). No acute hemorrhage, mass, mass effect, or midline shift. Lacunar infarcts in the bilateral  basal ganglia and left thalamus. No hydrocephalus or extra-axial collection. Vascular: No hyperdense vessel. Skull: Normal. Negative for fracture or focal lesion. Sinuses/Orbits: Complete opacification of the bilateral maxillary sinuses, with partial opacification of the ethmoid air cells, left frontal sinus, and left sphenoid sinus, which demonstrates an air-fluid level. The orbits are unremarkable. Other: Fluid in the left-greater-than-right mastoid air cells. IMPRESSION: 1. Hypodensity in the left occipital lobe, concerning for acute infarct. 2. Air-fluid level in left sphenoid sinus with opacification of most of the and sinuses, concerning for sinusitis. Correlate with symptoms. These results were called by telephone at the time of interpretation on 02/14/2021 at 2:20 pm to provider [facility] , who verbally acknowledged these results. Electronically Signed   By: Merilyn Baba M.D.   On: 02/14/2021 14:20   MR BRAIN WO CONTRAST  Result Date: 02/15/2021 CLINICAL DATA:  Initial evaluation for acute stroke, neuro deficit. EXAM: MRI HEAD WITHOUT CONTRAST TECHNIQUE: Multiplanar, multiecho pulse sequences of the brain and surrounding structures were obtained without intravenous contrast. COMPARISON:  Prior head CT from earlier the same day. FINDINGS: Brain: Cerebral volume within normal limits. Extensive patchy and confluent T2/FLAIR hyperintensity involving the periventricular deep white matter both cerebral hemispheres, most consistent with chronic microvascular ischemic disease, fairly advanced in nature. Mild patchy involvement of the pons noted. Multiple scattered superimposed remote lacunar infarcts present about the hemispheric cerebral white matter, deep gray nuclei, and pons. Confluent restricted diffusion involving the parasagittal left temporal occipital region, consistent with an acute to early subacute left PCA distribution infarct. Patchy involvement of the left thalamus and left hippocampal  formation. Mild petechial hemorrhage without frank hemorrhagic transformation or significant mass effect. No other evidence for acute or subacute infarct. Gray-white matter differentiation otherwise maintained. No other areas of chronic cortical infarction. No acute intracranial hemorrhage. Innumerable scattered chronic micro hemorrhages noted involving both cerebral hemispheres and cerebellum, nonspecific, and could be related to chronic poorly controlled hypertension or possibly cerebral amyloid angiopathy. No mass lesion, midline shift or mass effect. No hydrocephalus or extra-axial fluid collection. Pituitary gland suprasellar region within normal limits. Midline structures intact. Vascular: Major intracranial vascular flow voids are grossly maintained. Skull and upper cervical spine: Craniocervical junction within normal limits. Bone marrow signal intensity diffusely decreased on T1 weighted sequence, nonspecific, but most commonly related to anemia, smoking or obesity. No focal marrow replacing lesion. No scalp soft tissue abnormality. Sinuses/Orbits: Globes and orbital soft tissues demonstrate no acute finding. Chronic pan sinusitis noted. Superimposed air-fluid level within the left sphenoid sinus suggest acute on chronic disease. Left mastoid and middle ear effusion noted. Visualized nasopharynx unremarkable. Other: None. IMPRESSION: 1. Acute to early subacute left PCA distribution infarct as above. Mild petechial hemorrhage without frank hemorrhagic transformation or significant mass effect. 2. Underlying advanced chronic microvascular ischemic disease with multiple remote lacunar infarcts involving the hemispheric cerebral white matter, deep gray nuclei, and pons. 3. Innumerable scattered chronic  micro hemorrhages involving both cerebral hemispheres and cerebellum, nonspecific, but could be related to chronic poorly controlled hypertension or possibly cerebral amyloid angiopathy. 4. Chronic pan sinusitis  with left mastoid and middle ear effusion. Electronically Signed   By: Jeannine Boga M.D.   On: 02/15/2021 00:02   US Carotid Bilateral (at [facility] and [facility] only)  Result Date: 02/15/2021 CLINICAL DATA:  71 year old female with a history of acute confusion EXAM: BILATERAL CAROTID DUPLEX ULTRASOUND TECHNIQUE: Pearline Cables scale imaging, color Doppler and duplex ultrasound were performed of bilateral carotid and vertebral arteries in the neck. COMPARISON:  None. FINDINGS: Criteria: Quantification of carotid stenosis is based on velocity parameters that correlate the residual internal carotid diameter with NASCET-based stenosis levels, using the diameter of the distal internal carotid lumen as the denominator for stenosis measurement. The following velocity measurements were obtained: RIGHT ICA:  Systolic 73 cm/sec, Diastolic 25 cm/sec CCA:  937 cm/sec SYSTOLIC ICA/CCA RATIO:  0.6 ECA:  148 cm/sec LEFT ICA:  Systolic 902 cm/sec, Diastolic 32 cm/sec CCA:  409 cm/sec SYSTOLIC ICA/CCA RATIO:  0.9 ECA:  119 cm/sec Right Brachial SBP: Not acquired Left Brachial SBP: Not acquired RIGHT CAROTID ARTERY: No significant calcifications of the right common carotid artery. Intermediate waveform maintained. Heterogeneous and partially calcified plaque at the right carotid bifurcation. No significant lumen shadowing. Low resistance waveform of the right ICA. No significant tortuosity. RIGHT VERTEBRAL ARTERY: Antegrade flow with low resistance waveform. LEFT CAROTID ARTERY: No significant calcifications of the left common carotid artery. Intermediate waveform maintained. Heterogeneous and partially calcified plaque at the left carotid bifurcation without significant lumen shadowing. Low resistance waveform of the left ICA. No significant tortuosity. LEFT VERTEBRAL ARTERY:  Antegrade flow with low resistance waveform. IMPRESSION: Color duplex indicates minimal heterogeneous and calcified plaque, with no hemodynamically significant  stenosis by duplex criteria in the extracranial cerebrovascular circulation. Signed, Dulcy Fanny. Dellia Nims, RPVI [facility]iology Electronically Signed   By: Corrie Mckusick D.O.   On: 02/15/2021 09:14   ECHOCARDIOGRAM COMPLETE  Result Date: 02/16/2021    ECHOCARDIOGRAM REPORT   Patient Name:   JAIDENCE GEISLER Date of Exam: 02/16/2021 Medical Rec #:  [id number]            Height:       61.0 in Accession #:    [id number]           Weight:       249.4 lb Date of Birth:  [birth year]           BSA:          2.075 m Patient Age:    58 years             BP:           167/72 mmHg Patient Gender: F                    HR:           95 bpm. Exam Location:  [facility] Procedure: 2D Echo, Color Doppler, Cardiac Doppler and Intracardiac            Opacification Agent Indications:     G45.9 TIA  History:         Patient has no prior history of Echocardiogram examinations.                  Signs/Symptoms:Dizziness/Lightheadedness; Risk  Factors:Hypertension.  Sonographer:     Charmayne Sheer Referring Phys:  [id number] Athena Masse Diagnosing Phys: Neoma Laming  Sonographer Comments: Technically difficult study due to poor echo windows. Image acquisition challenging due to patient body habitus. IMPRESSIONS  1. Left ventricular ejection fraction, by estimation, is 55 to 60%. The left ventricle has normal function. The left ventricle has no regional wall motion abnormalities. There is mild concentric left ventricular hypertrophy. Left ventricular diastolic parameters are consistent with Grade I diastolic dysfunction (impaired relaxation).  2. Right ventricular systolic function is normal. The right ventricular size is normal.  3. Left atrial size was mildly dilated.  4. Right atrial size was mildly dilated.  5. The mitral valve is normal in structure. No evidence of mitral valve regurgitation. No evidence of mitral stenosis.  6. The aortic valve is normal in structure.  Aortic valve regurgitation is not visualized. No aortic stenosis is present.  7. The inferior vena cava is normal in size with greater than 50% respiratory variability, suggesting right atrial pressure of 3 mmHg. FINDINGS  Left Ventricle: Left ventricular ejection fraction, by estimation, is 55 to 60%. The left ventricle has normal function. The left ventricle has no regional wall motion abnormalities. Definity contrast agent was given IV to delineate the left ventricular  endocardial borders. The left ventricular internal cavity size was normal in size. There is mild concentric left ventricular hypertrophy. Left ventricular diastolic parameters are consistent with Grade I diastolic dysfunction (impaired relaxation). Right Ventricle: The right ventricular size is normal. No increase in right ventricular wall thickness. Right ventricular systolic function is normal. Left Atrium: Left atrial size was mildly dilated. Right Atrium: Right atrial size was mildly dilated. Pericardium: There is no evidence of pericardial effusion. Mitral Valve: The mitral valve is normal in structure. No evidence of mitral valve regurgitation. No evidence of mitral valve stenosis. MV peak gradient, 4.2 mmHg. The mean mitral valve gradient is 2.0 mmHg. Tricuspid Valve: The tricuspid valve is normal in structure. Tricuspid valve regurgitation is not demonstrated. No evidence of tricuspid stenosis. Aortic Valve: The aortic valve is normal in structure. Aortic valve regurgitation is not visualized. No aortic stenosis is present. Aortic valve mean gradient measures 5.0 mmHg. Aortic valve peak gradient measures 8.5 mmHg. Aortic valve area, by VTI measures 1.86 cm. Pulmonic Valve: The pulmonic valve was normal in structure. Pulmonic valve regurgitation is not visualized. No evidence of pulmonic stenosis. Aorta: The aortic root is normal in size and structure. Venous: The inferior vena cava is normal in size with greater than 50% respiratory  variability, suggesting right atrial pressure of 3 mmHg. IAS/Shunts: No atrial level shunt detected by color flow Doppler.  LEFT VENTRICLE PLAX 2D LVIDd:         3.83 cm   Diastology LVIDs:         2.78 cm   LV e' medial:    7.51 cm/s LV PW:         1.36 cm   LV E/e' medial:  8.8 LV IVS:        0.71 cm   LV e' lateral:   7.29 cm/s LVOT diam:     2.00 cm   LV E/e' lateral: 9.1 LV SV:         48 LV SV Index:   23 LVOT Area:     3.14 cm  LEFT ATRIUM           Index LA diam:      3.60 cm 1.74  cm/m LA Vol (A4C): 33.3 ml 16.05 ml/m  AORTIC VALVE                     PULMONIC VALVE AV Area (Vmax):    1.77 cm      PV Vmax:       1.04 m/s AV Area (Vmean):   1.90 cm      PV Vmean:      75.000 cm/s AV Area (VTI):     1.86 cm      PV VTI:        0.170 m AV Vmax:           146.00 cm/s   PV Peak grad:  4.3 mmHg AV Vmean:          101.000 cm/s  PV Mean grad:  3.0 mmHg AV VTI:            0.257 m AV Peak Grad:      8.5 mmHg AV Mean Grad:      5.0 mmHg LVOT Vmax:         82.20 cm/s LVOT Vmean:        61.200 cm/s LVOT VTI:          0.152 m LVOT/AV VTI ratio: 0.59  AORTA Ao Root diam: 3.10 cm MITRAL VALVE MV Area (PHT): 9.14 cm    SHUNTS MV Area VTI:   2.96 cm    Systemic VTI:  0.15 m MV Peak grad:  4.2 mmHg    Systemic Diam: 2.00 cm MV Mean grad:  2.0 mmHg MV Vmax:       1.02 m/s MV Vmean:      74.0 cm/s MV Decel Time: 83 msec MV E velocity: 66.00 cm/s MV A velocity: 92.10 cm/s MV E/A ratio:  0.72 Shaukat Khan Electronically signed by Neoma Laming Signature Date/Time: 02/16/2021/11:31:52 AM    Final     Microbiology: Results for orders placed or performed during the hospital encounter of 02/14/21  Resp Panel by RT-PCR (Flu A&B, Covid) Nasopharyngeal Swab     Status: None   Collection Time: 02/14/21 10:05 PM   Specimen: Nasopharyngeal Swab; Nasopharyngeal(NP) swabs in vial transport medium  Result Value Ref Range Status   SARS Coronavirus 2 by RT PCR NEGATIVE NEGATIVE Final    Comment: (NOTE) SARS-CoV-2 target nucleic  acids are NOT DETECTED.  The SARS-CoV-2 RNA is generally detectable in upper respiratory specimens during the acute phase of infection. The lowest concentration of SARS-CoV-2 viral copies this assay can detect is 138 copies/mL. A negative result does not preclude SARS-Cov-2 infection and should not be used as the sole basis for treatment or other patient management decisions. A negative result may occur with  improper specimen collection/handling, submission of specimen other than nasopharyngeal swab, presence of viral mutation(s) within the areas targeted by this assay, and inadequate number of viral copies(<138 copies/mL). A negative result must be combined with clinical observations, patient history, and epidemiological information. The expected result is Negative.  Fact Sheet for Patients:  EntrepreneurPulse.com.au  Fact Sheet for Healthcare Providers:  IncredibleEmployment.be  This test is no t yet approved or cleared by the [place] FDA and  has been authorized for detection and/or diagnosis of SARS-CoV-2 by FDA under an Emergency Use Authorization (EUA). This EUA will remain  in effect (meaning this test can be used) for the duration of the COVID-19 declaration under Section 564(b)(1) of the Act, 21 U.S.C.section 360bbb-3(b)(1), unless the authorization is terminated  or  revoked sooner.       Influenza A by PCR NEGATIVE NEGATIVE Final   Influenza B by PCR NEGATIVE NEGATIVE Final    Comment: (NOTE) The Xpert Xpress SARS-CoV-2/FLU/RSV plus assay is intended as an aid in the diagnosis of influenza from Nasopharyngeal swab specimens and should not be used as a sole basis for treatment. Nasal washings and aspirates are unacceptable for Xpert Xpress SARS-CoV-2/FLU/RSV testing.  Fact Sheet for Patients: EntrepreneurPulse.com.au  Fact Sheet for Healthcare Providers: IncredibleEmployment.be  This  test is not yet approved or cleared by the [place] FDA and has been authorized for detection and/or diagnosis of SARS-CoV-2 by FDA under an Emergency Use Authorization (EUA). This EUA will remain in effect (meaning this test can be used) for the duration of the COVID-19 declaration under Section 564(b)(1) of the Act, 21 U.S.C. section 360bbb-3(b)(1), unless the authorization is terminated or revoked.  Performed at [facility], [address] [place], [place] [address]     Labs: CBC: Recent Labs  Lab 02/14/21 1540 02/14/21 2350  WBC 7.9 7.7  HGB 13.0 14.1  HCT 40.4 44.2  MCV 97.6 98.0  PLT 276 008   Basic Metabolic Panel: Recent Labs  Lab 02/14/21 1540 02/14/21 2350 02/17/21 0550  NA 138  --  140  K 3.6  --  3.7  CL 101  --  104  CO2 28  --  28  GLUCOSE 112*  --  141*  BUN 21  --  17  CREATININE 0.96 0.82 0.83  CALCIUM 9.3  --  8.9  MG  --   --  2.2   Liver Function Tests: No results for input(s): AST, ALT, ALKPHOS, BILITOT, PROT, ALBUMIN in the last 168 hours. CBG: No results for input(s): GLUCAP in the last 168 hours.  Discharge time spent: greater than 30 minutes.  Signed: Ezekiel Slocumb, DO [facility] 02/18/2021

## 2021-02-18 DIAGNOSIS — E66813 Obesity, class 3: Secondary | ICD-10-CM | POA: Diagnosis present

## 2021-02-18 DIAGNOSIS — I161 Hypertensive emergency: Secondary | ICD-10-CM | POA: Diagnosis not present

## 2021-02-18 MED ORDER — AMLODIPINE BESYLATE 5 MG PO TABS
5.0000 mg | ORAL_TABLET | Freq: Every day | ORAL | Status: DC
  Administered 2021-02-18: 5 mg via ORAL
  Filled 2021-02-18: qty 1

## 2021-02-18 MED ORDER — SPIRONOLACTONE 25 MG PO TABS: 25.0000 mg | ORAL_TABLET | Freq: Every day | ORAL | Status: DC

## 2021-02-18 MED ORDER — AMLODIPINE BESYLATE 5 MG PO TABS: 5.0000 mg | ORAL_TABLET | Freq: Every day | ORAL | 2 refills | Status: DC

## 2021-02-18 NOTE — TOC Progression Note (Signed)
Transition of Care [facility]) - Progression Note    Patient Details  Name: Karen Chan MRN: [id number] Date of Birth: 09-05-1950  Transition of [facility]) CM/SW [place], RN Phone Number: 02/18/2021, 8:35 AM  Clinical Narrative:   Patient is set up with home health from [facility] for nursing disease management.  Outpatient PT. Ordered.    Expected Discharge Plan: Home/Self Care (outpatient physical therapy) Barriers to Discharge: Continued Medical Work up  Expected Discharge Plan and Services Expected Discharge Plan: Home/Self Care (outpatient physical therapy)   Discharge Planning Services: CM Consult Post Acute Care Choice: Durable Medical Equipment (outpatient physical therapy) Living arrangements for the past 2 months: Single Family Home Expected Discharge Date: 02/17/21               DME Arranged: 3-N-1 DME Agency: [employer] Date DME Agency Contacted: 02/15/21   Representative spoke with at DME Agency: Suanne Marker             Social Determinants of Health ([place]) Interventions    Readmission Risk Interventions No flowsheet data found.

## 2021-02-18 NOTE — Plan of Care (Signed)
°  Problem: Education: Goal: Knowledge of General Education information will improve Description: Including pain rating scale, medication(s)/side effects and non-pharmacologic comfort measures 02/18/2021 1203 by Lavaeh Bau, Camillo Flaming, RN Outcome: Adequate for Discharge 02/18/2021 1103 by Endora Teresi, Camillo Flaming, RN Outcome: Progressing   Problem: Health Behavior/Discharge Planning: Goal: Ability to manage health-related needs will improve 02/18/2021 1203 by Keandre Linden, Camillo Flaming, RN Outcome: Adequate for Discharge 02/18/2021 1103 by Treyvon Blahut, Camillo Flaming, RN Outcome: Progressing   Problem: Clinical Measurements: Goal: Ability to maintain clinical measurements within normal limits will improve 02/18/2021 1203 by Karishma Unrein, Camillo Flaming, RN Outcome: Adequate for Discharge 02/18/2021 1103 by HuskinsCamillo Flaming, RN Outcome: Progressing Goal: Will remain free from infection 02/18/2021 1203 by Srikar Chiang, Camillo Flaming, RN Outcome: Adequate for Discharge 02/18/2021 1103 by HuskinsCamillo Flaming, RN Outcome: Progressing Goal: Diagnostic test results will improve 02/18/2021 1203 by Tariya Morrissette, Camillo Flaming, RN Outcome: Adequate for Discharge 02/18/2021 1103 by HuskinsCamillo Flaming, RN Outcome: Progressing Goal: Respiratory complications will improve 02/18/2021 1203 by Avaleigh Decuir, Camillo Flaming, RN Outcome: Adequate for Discharge 02/18/2021 1103 by HuskinsCamillo Flaming, RN Outcome: Progressing Goal: Cardiovascular complication will be avoided 02/18/2021 1203 by Seward Coran, Camillo Flaming, RN Outcome: Adequate for Discharge 02/18/2021 1103 by Karagan Lehr, Camillo Flaming, RN Outcome: Progressing   Problem: Activity: Goal: Risk for activity intolerance will decrease 02/18/2021 1203 by Donivin Wirt, Camillo Flaming, RN Outcome: Adequate for Discharge 02/18/2021 1103 by Albin Duckett, Camillo Flaming, RN Outcome: Progressing   Problem: Nutrition: Goal: Adequate nutrition will be maintained 02/18/2021 1203 by Glenden Rossell, Camillo Flaming, RN Outcome: Adequate for Discharge 02/18/2021 1103 by  Rithy Mandley, Camillo Flaming, RN Outcome: Progressing   Problem: Coping: Goal: Level of anxiety will decrease 02/18/2021 1203 by Nishika Parkhurst, Camillo Flaming, RN Outcome: Adequate for Discharge 02/18/2021 1103 by Walsie Smeltz, Camillo Flaming, RN Outcome: Progressing   Problem: Elimination: Goal: Will not experience complications related to bowel motility 02/18/2021 1203 by Riannah Stagner, Camillo Flaming, RN Outcome: Adequate for Discharge 02/18/2021 1103 by Brenlynn Fake, Camillo Flaming, RN Outcome: Progressing Goal: Will not experience complications related to urinary retention 02/18/2021 1203 by Audrena Talaga, Camillo Flaming, RN Outcome: Adequate for Discharge 02/18/2021 1103 by Lashante Fryberger, Camillo Flaming, RN Outcome: Progressing   Problem: Pain Managment: Goal: General experience of comfort will improve 02/18/2021 1203 by Prakash Kimberling, Camillo Flaming, RN Outcome: Adequate for Discharge 02/18/2021 1103 by Fiona Coto, Camillo Flaming, RN Outcome: Progressing   Problem: Safety: Goal: Ability to remain free from injury will improve 02/18/2021 1203 by Raynard Mapps, Camillo Flaming, RN Outcome: Adequate for Discharge 02/18/2021 1103 by Trevaughn Schear, Camillo Flaming, RN Outcome: Progressing   Problem: Skin Integrity: Goal: Risk for impaired skin integrity will decrease 02/18/2021 1203 by March Steyer, Camillo Flaming, RN Outcome: Adequate for Discharge 02/18/2021 1103 by Macaela Presas, Camillo Flaming, RN Outcome: Progressing   Problem: Education: Goal: Knowledge of secondary prevention will improve ([facility]) 02/18/2021 1203 by Daisy Blossom, RN Outcome: Adequate for Discharge 02/18/2021 1103 by Sheamus Hasting, Camillo Flaming, RN Outcome: Progressing

## 2021-02-18 NOTE — Plan of Care (Signed)
°  Problem: Education: Goal: Knowledge of General Education information will improve Description: Including pain rating scale, medication(s)/side effects and non-pharmacologic comfort measures Outcome: Progressing   Problem: Health Behavior/Discharge Planning: Goal: Ability to manage health-related needs will improve Outcome: Progressing   Problem: Clinical Measurements: Goal: Ability to maintain clinical measurements within normal limits will improve Outcome: Progressing Goal: Will remain free from infection Outcome: Progressing Goal: Diagnostic test results will improve Outcome: Progressing Goal: Respiratory complications will improve Outcome: Progressing Goal: Cardiovascular complication will be avoided Outcome: Progressing   Problem: Activity: Goal: Risk for activity intolerance will decrease Outcome: Progressing   Problem: Nutrition: Goal: Adequate nutrition will be maintained Outcome: Progressing   Problem: Coping: Goal: Level of anxiety will decrease Outcome: Progressing   Problem: Elimination: Goal: Will not experience complications related to bowel motility Outcome: Progressing Goal: Will not experience complications related to urinary retention Outcome: Progressing   Problem: Pain Managment: Goal: General experience of comfort will improve Outcome: Progressing   Problem: Safety: Goal: Ability to remain free from injury will improve Outcome: Progressing   Problem: Skin Integrity: Goal: Risk for impaired skin integrity will decrease Outcome: Progressing   Problem: Education: Goal: Knowledge of secondary prevention will improve (SELECT ALL) Outcome: Progressing   

## 2021-02-18 NOTE — Care Management Important Message (Signed)
Important Message  Patient Details  Name: Karen Chan MRN: [id number] Date of Birth: 1950/12/15   Medicare Important Message Given:  Yes     Dannette Barbara 02/18/2021, 11:44 AM

## 2021-02-22 ENCOUNTER — Other Ambulatory Visit: Payer: Self-pay

## 2021-02-22 ENCOUNTER — Encounter: Payer: Self-pay | Admitting: Emergency Medicine

## 2021-02-22 ENCOUNTER — Emergency Department
Admission: EM | Admit: 2021-02-22 | Discharge: 2021-02-22 | Disposition: A | Discharge status: Home / Self Care | Payer: Medicare HMO | Attending: Emergency Medicine | Admitting: Emergency Medicine

## 2021-02-22 ENCOUNTER — Emergency Department: Payer: Medicare HMO

## 2021-02-22 DIAGNOSIS — M1711 Unilateral primary osteoarthritis, right knee: Secondary | ICD-10-CM | POA: Diagnosis not present

## 2021-02-22 DIAGNOSIS — S8262XA Displaced fracture of lateral malleolus of left fibula, initial encounter for closed fracture: Secondary | ICD-10-CM | POA: Diagnosis not present

## 2021-02-22 DIAGNOSIS — I63532 Cerebral infarction due to unspecified occlusion or stenosis of left posterior cerebral artery: Secondary | ICD-10-CM | POA: Diagnosis not present

## 2021-02-22 DIAGNOSIS — W010XXA Fall on same level from slipping, tripping and stumbling without subsequent striking against object, initial encounter: Secondary | ICD-10-CM | POA: Insufficient documentation

## 2021-02-22 DIAGNOSIS — M7989 Other specified soft tissue disorders: Secondary | ICD-10-CM | POA: Diagnosis not present

## 2021-02-22 DIAGNOSIS — J3089 Other allergic rhinitis: Secondary | ICD-10-CM | POA: Diagnosis not present

## 2021-02-22 DIAGNOSIS — I10 Essential (primary) hypertension: Secondary | ICD-10-CM | POA: Insufficient documentation

## 2021-02-22 DIAGNOSIS — E782 Mixed hyperlipidemia: Secondary | ICD-10-CM | POA: Diagnosis not present

## 2021-02-22 DIAGNOSIS — M25561 Pain in right knee: Secondary | ICD-10-CM | POA: Insufficient documentation

## 2021-02-22 DIAGNOSIS — M25571 Pain in right ankle and joints of right foot: Secondary | ICD-10-CM | POA: Insufficient documentation

## 2021-02-22 DIAGNOSIS — R531 Weakness: Secondary | ICD-10-CM | POA: Diagnosis not present

## 2021-02-22 DIAGNOSIS — S8264XD Nondisplaced fracture of lateral malleolus of right fibula, subsequent encounter for closed fracture with routine healing: Secondary | ICD-10-CM | POA: Diagnosis not present

## 2021-02-22 DIAGNOSIS — S8261XA Displaced fracture of lateral malleolus of right fibula, initial encounter for closed fracture: Secondary | ICD-10-CM | POA: Diagnosis not present

## 2021-02-22 DIAGNOSIS — M79604 Pain in right leg: Secondary | ICD-10-CM | POA: Diagnosis not present

## 2021-02-22 DIAGNOSIS — W19XXXA Unspecified fall, initial encounter: Secondary | ICD-10-CM

## 2021-02-22 HISTORY — DX: Cerebral infarction, unspecified: I63.9

## 2021-02-22 MED ORDER — HYDROCODONE-ACETAMINOPHEN 5-325 MG PO TABS: 1.0000 | ORAL_TABLET | Freq: Four times a day (QID) | ORAL | 0 refills | Status: AC | PRN

## 2021-02-22 NOTE — ED Notes (Signed)
[facility] called to take pt to appointment. Family members aware they will need to provide pt a ride home after appointment,

## 2021-02-22 NOTE — ED Notes (Signed)
Pt alert, resting in bed with SO at bedside. Encouraged pt to let RN know of any needs.

## 2021-02-22 NOTE — ED Notes (Signed)
CAM boot placed. Pt and family educated on how to apply

## 2021-02-22 NOTE — ED Triage Notes (Signed)
Pt arrives via [facility], reports fall out of bed on right side with right leg pain.  Recent HX of stroke.

## 2021-02-22 NOTE — ED Provider Notes (Signed)
[facility] Provider Note    Event Date/Time   First MD Initiated Contact with Patient 02/22/21 [phone]     (approximate)   History   Chief Complaint Fall   HPI  Karen Chan is a 71 y.o. female with past medical history of hypertension and stroke who presents to the ED complaining of fall.  History is limited due to patient's confusion and majority of history is obtained from husband at the bedside.  He states that while he was attempting to get the patient up from bed this morning, she had a fall.  She was apparently sitting on the side of the bed when her leg slid out from under her, causing her to strike her right leg and her backside.  He states that she did not hit her head or lose consciousness but has complained of pain in her right leg since the fall.  He has not noticed any numbness or weakness since the fall, states that patient has dealt with confusion since recent stroke, but he is currently at her baseline mental status.     Physical Exam   Triage Vital Signs: ED Triage Vitals  Enc Vitals Group     BP 02/22/21 0608 (!) 151/75     Pulse Rate 02/22/21 0608 89     Resp 02/22/21 0608 20     Temp --      Temp src --      SpO2 02/22/21 0607 95 %     Weight 02/22/21 0609 256 lb 11.2 oz (116.4 kg)     Height 02/22/21 0609 5\' 1"  (1.549 m)     Head Circumference --      Peak Flow --      Pain Score 02/22/21 0608 10     Pain Loc --      Pain Edu? --      Excl. in [place]? --     Most recent vital signs: Vitals:   02/22/21 0607 02/22/21 0608  BP:  (!) 151/75  Pulse:  89  Resp:  20  SpO2: 95% 98%    Constitutional: Alert and oriented. Eyes: Conjunctivae are normal. Head: Atraumatic. Nose: No congestion/rhinnorhea. Mouth/Throat: Mucous membranes are moist.  Neck: No midline cervical spine tenderness to palpation. Cardiovascular: Normal rate, regular rhythm. Grossly normal heart sounds.  2+ DP pulses bilaterally. Respiratory: Normal  respiratory effort.  No retractions. Lungs CTAB. Gastrointestinal: Soft and nontender. No distention. Genitourinary: Deferred. Musculoskeletal: Diffuse tenderness to palpation at right knee and ankle with no obvious deformity.  No tenderness to palpation over left lower extremity or bilateral upper extremities. Neurologic:  Normal speech and language. No gross focal neurologic deficits are appreciated.    ED Results / Procedures / Treatments   Labs (all labs ordered are listed, but only abnormal results are displayed) Labs Reviewed - No data to display   EKG  ED ECG REPORT I, Blake Divine, the attending physician, personally viewed and interpreted this ECG.   Date: 02/22/2021  EKG Time: 6:24  Rate: 82  Rhythm: normal sinus rhythm  Axis: LAD  Intervals:none  ST&T Change: None  RADIOLOGY X-rays of right knee and ankle reviewed by me with no fracture or dislocation.  PROCEDURES:  Critical Care performed: No  Procedures   MEDICATIONS ORDERED IN ED: Medications - No data to display   IMPRESSION / MDM / [place] / ED COURSE  I reviewed the triage vital signs and the nursing notes.  71 y.o. female with past medical history of hypertension and recent stroke who presents to the ED complaining of slip and fall when attempting to get up out of bed this morning per husband.  Differential diagnosis includes, but is not limited to, leg fracture, dislocation, or contusion.  Patient is well-appearing and in no acute distress, appears slightly confused but this is apparently her baseline following recent stroke her husband.  She has no focal neurologic deficits on exam and has been denies any trauma to her head or neck area.  Patient primarily complains of pain to her right leg, is diffusely tender at the knee and ankle.  We will further assess with x-ray, she is neurovascular intact to her bilateral lower extremities.  X-rays reviewed by  me and show no fracture or dislocation, radiology read is pending.  If these are unremarkable, patient be appropriate for discharge home with outpatient follow-up, remains at her baseline mental status per husband.       FINAL CLINICAL IMPRESSION(S) / ED DIAGNOSES   Final diagnoses:  Fall, initial encounter     Rx / DC Orders   ED Discharge Orders     None        Note:  This document was prepared using Dragon voice recognition software and may include unintentional dictation errors.   Blake Divine, MD 02/22/21 [phone]

## 2021-02-22 NOTE — ED Notes (Signed)
Pt taken to Xray at this time.

## 2021-02-22 NOTE — ED Notes (Signed)
Pt returned from Xray at this time.  Warm blanket given per her request.

## 2021-02-22 NOTE — ED Provider Notes (Signed)
Plain films reviewed by me.  Patient has mildly comminuted fracture involving the distal tip of the lateral malleolus.  This appears inferior to the ankle mortise.  She is neurovascularly intact.  Will give a brief course of analgesia, advised RICE, and placed in a cam walker with weightbearing as tolerated.  This was discussed with Dr. Sharlet Salina of orthopedics via telephone.  Will discharge with outpatient follow-up.   Duffy Bruce, MD 02/22/21 [phone]

## 2021-03-04 DIAGNOSIS — E782 Mixed hyperlipidemia: Secondary | ICD-10-CM | POA: Diagnosis not present

## 2021-03-04 DIAGNOSIS — I10 Essential (primary) hypertension: Secondary | ICD-10-CM | POA: Diagnosis not present

## 2021-03-04 DIAGNOSIS — R7302 Impaired glucose tolerance (oral): Secondary | ICD-10-CM | POA: Diagnosis not present

## 2021-03-04 DIAGNOSIS — E559 Vitamin D deficiency, unspecified: Secondary | ICD-10-CM | POA: Diagnosis not present

## 2021-03-04 DIAGNOSIS — I63532 Cerebral infarction due to unspecified occlusion or stenosis of left posterior cerebral artery: Secondary | ICD-10-CM | POA: Diagnosis not present

## 2021-03-07 DIAGNOSIS — S82831A Other fracture of upper and lower end of right fibula, initial encounter for closed fracture: Secondary | ICD-10-CM | POA: Diagnosis not present

## 2021-03-28 DIAGNOSIS — S82831A Other fracture of upper and lower end of right fibula, initial encounter for closed fracture: Secondary | ICD-10-CM | POA: Diagnosis not present

## 2021-03-28 DIAGNOSIS — S93401A Sprain of unspecified ligament of right ankle, initial encounter: Secondary | ICD-10-CM | POA: Diagnosis not present

## 2021-04-06 DIAGNOSIS — R531 Weakness: Secondary | ICD-10-CM | POA: Diagnosis not present

## 2021-04-06 DIAGNOSIS — Z8673 Personal history of transient ischemic attack (TIA), and cerebral infarction without residual deficits: Secondary | ICD-10-CM | POA: Diagnosis not present

## 2021-04-06 DIAGNOSIS — R262 Difficulty in walking, not elsewhere classified: Secondary | ICD-10-CM | POA: Diagnosis not present

## 2021-04-15 DIAGNOSIS — E782 Mixed hyperlipidemia: Secondary | ICD-10-CM | POA: Diagnosis not present

## 2021-04-15 DIAGNOSIS — R002 Palpitations: Secondary | ICD-10-CM | POA: Diagnosis not present

## 2021-04-15 DIAGNOSIS — I10 Essential (primary) hypertension: Secondary | ICD-10-CM | POA: Diagnosis not present

## 2021-04-15 DIAGNOSIS — E119 Type 2 diabetes mellitus without complications: Secondary | ICD-10-CM | POA: Diagnosis not present

## 2021-04-15 DIAGNOSIS — F411 Generalized anxiety disorder: Secondary | ICD-10-CM | POA: Diagnosis not present

## 2021-04-28 DIAGNOSIS — S93401A Sprain of unspecified ligament of right ankle, initial encounter: Secondary | ICD-10-CM | POA: Diagnosis not present

## 2021-04-29 ENCOUNTER — Other Ambulatory Visit: Payer: Self-pay | Admitting: Internal Medicine

## 2021-04-29 DIAGNOSIS — E119 Type 2 diabetes mellitus without complications: Secondary | ICD-10-CM | POA: Diagnosis not present

## 2021-04-29 DIAGNOSIS — Z1231 Encounter for screening mammogram for malignant neoplasm of breast: Secondary | ICD-10-CM

## 2021-04-29 DIAGNOSIS — I10 Essential (primary) hypertension: Secondary | ICD-10-CM | POA: Diagnosis not present

## 2021-04-29 DIAGNOSIS — F411 Generalized anxiety disorder: Secondary | ICD-10-CM | POA: Diagnosis not present

## 2021-04-29 DIAGNOSIS — E782 Mixed hyperlipidemia: Secondary | ICD-10-CM | POA: Diagnosis not present

## 2021-05-04 DIAGNOSIS — H5200 Hypermetropia, unspecified eye: Secondary | ICD-10-CM | POA: Diagnosis not present

## 2021-05-04 DIAGNOSIS — Z0100 Encounter for examination of eyes and vision without abnormal findings: Secondary | ICD-10-CM | POA: Diagnosis not present

## 2021-05-04 DIAGNOSIS — E7800 Pure hypercholesterolemia, unspecified: Secondary | ICD-10-CM | POA: Diagnosis not present

## 2021-06-10 DIAGNOSIS — E119 Type 2 diabetes mellitus without complications: Secondary | ICD-10-CM | POA: Diagnosis not present

## 2021-06-10 DIAGNOSIS — I10 Essential (primary) hypertension: Secondary | ICD-10-CM | POA: Diagnosis not present

## 2021-06-10 DIAGNOSIS — I63532 Cerebral infarction due to unspecified occlusion or stenosis of left posterior cerebral artery: Secondary | ICD-10-CM | POA: Diagnosis not present

## 2021-06-10 DIAGNOSIS — E782 Mixed hyperlipidemia: Secondary | ICD-10-CM | POA: Diagnosis not present

## 2021-06-10 DIAGNOSIS — E668 Other obesity: Secondary | ICD-10-CM | POA: Diagnosis not present

## 2021-06-13 DIAGNOSIS — I10 Essential (primary) hypertension: Secondary | ICD-10-CM | POA: Diagnosis not present

## 2021-06-13 DIAGNOSIS — E119 Type 2 diabetes mellitus without complications: Secondary | ICD-10-CM | POA: Diagnosis not present

## 2021-06-13 DIAGNOSIS — E782 Mixed hyperlipidemia: Secondary | ICD-10-CM | POA: Diagnosis not present

## 2021-06-17 DIAGNOSIS — E782 Mixed hyperlipidemia: Secondary | ICD-10-CM | POA: Diagnosis not present

## 2021-06-17 DIAGNOSIS — J3089 Other allergic rhinitis: Secondary | ICD-10-CM | POA: Diagnosis not present

## 2021-06-17 DIAGNOSIS — E119 Type 2 diabetes mellitus without complications: Secondary | ICD-10-CM | POA: Diagnosis not present

## 2021-06-17 DIAGNOSIS — N760 Acute vaginitis: Secondary | ICD-10-CM | POA: Diagnosis not present

## 2021-06-17 DIAGNOSIS — I10 Essential (primary) hypertension: Secondary | ICD-10-CM | POA: Diagnosis not present

## 2021-06-17 DIAGNOSIS — Z0000 Encounter for general adult medical examination without abnormal findings: Secondary | ICD-10-CM | POA: Diagnosis not present

## 2021-06-17 DIAGNOSIS — Z113 Encounter for screening for infections with a predominantly sexual mode of transmission: Secondary | ICD-10-CM | POA: Diagnosis not present

## 2021-06-17 DIAGNOSIS — M8589 Other specified disorders of bone density and structure, multiple sites: Secondary | ICD-10-CM | POA: Diagnosis not present

## 2021-06-17 DIAGNOSIS — N952 Postmenopausal atrophic vaginitis: Secondary | ICD-10-CM | POA: Diagnosis not present

## 2021-06-17 DIAGNOSIS — Z112 Encounter for screening for other bacterial diseases: Secondary | ICD-10-CM | POA: Diagnosis not present

## 2021-06-17 DIAGNOSIS — Z1151 Encounter for screening for human papillomavirus (HPV): Secondary | ICD-10-CM | POA: Diagnosis not present

## 2021-08-03 DIAGNOSIS — I6350 Cerebral infarction due to unspecified occlusion or stenosis of unspecified cerebral artery: Secondary | ICD-10-CM | POA: Diagnosis not present

## 2021-08-03 DIAGNOSIS — Z135 Encounter for screening for eye and ear disorders: Secondary | ICD-10-CM | POA: Diagnosis not present

## 2021-08-03 DIAGNOSIS — H04123 Dry eye syndrome of bilateral lacrimal glands: Secondary | ICD-10-CM | POA: Diagnosis not present

## 2021-08-03 DIAGNOSIS — H259 Unspecified age-related cataract: Secondary | ICD-10-CM | POA: Diagnosis not present

## 2021-08-03 DIAGNOSIS — H52223 Regular astigmatism, bilateral: Secondary | ICD-10-CM | POA: Diagnosis not present

## 2021-08-03 DIAGNOSIS — H524 Presbyopia: Secondary | ICD-10-CM | POA: Diagnosis not present

## 2021-09-13 DIAGNOSIS — I10 Essential (primary) hypertension: Secondary | ICD-10-CM | POA: Diagnosis not present

## 2021-09-13 DIAGNOSIS — E782 Mixed hyperlipidemia: Secondary | ICD-10-CM | POA: Diagnosis not present

## 2021-09-13 DIAGNOSIS — E119 Type 2 diabetes mellitus without complications: Secondary | ICD-10-CM | POA: Diagnosis not present

## 2021-09-15 ENCOUNTER — Other Ambulatory Visit: Payer: Self-pay

## 2021-09-15 ENCOUNTER — Other Ambulatory Visit: Payer: Self-pay | Admitting: Internal Medicine

## 2021-09-15 DIAGNOSIS — Z1231 Encounter for screening mammogram for malignant neoplasm of breast: Secondary | ICD-10-CM

## 2021-09-15 NOTE — Patient Outreach (Addendum)
[facility]) Care Management  09/15/2021  Elinda Bunten [birth year] [id number]   Telephone Screen    Outreach call to patient to introduce [facility] services and assess care needs as part of benefit of PCP office and insurance plan. Spoke with both patient and spouse via speaker phone at their request. No RN CM noted at this time.    Main healthcare issue/concern today: None reported. No issues with meds or transportation. Patient has HTN. Spouse states that they monitor BP a couple of times/wk. Patient on several BP meds-encouraged to monitor BP daily especially prior to taking BP meds. Spouse voices BP normally runs in the 115-120s.  Health Maintenance/Care Gaps Addressed & Education Provided:   -Last AWV:not on file. Spouse voices that their daughter normally makes appts. He will follow up with her regarding it.     Plan: RN CM will send successful outreach letter to patient for future reference.   Enzo [place], RN,BSN,CCM [place] Management Telephonic Care Management Coordinator Direct Phone: [phone] Toll Free: [phone] Fax: [phone]

## 2021-09-20 DIAGNOSIS — N959 Unspecified menopausal and perimenopausal disorder: Secondary | ICD-10-CM | POA: Diagnosis not present

## 2021-09-20 DIAGNOSIS — M8589 Other specified disorders of bone density and structure, multiple sites: Secondary | ICD-10-CM | POA: Diagnosis not present

## 2021-09-29 DIAGNOSIS — E119 Type 2 diabetes mellitus without complications: Secondary | ICD-10-CM | POA: Diagnosis not present

## 2021-10-06 DIAGNOSIS — E782 Mixed hyperlipidemia: Secondary | ICD-10-CM | POA: Diagnosis not present

## 2021-10-06 DIAGNOSIS — E119 Type 2 diabetes mellitus without complications: Secondary | ICD-10-CM | POA: Diagnosis not present

## 2021-10-06 DIAGNOSIS — I10 Essential (primary) hypertension: Secondary | ICD-10-CM | POA: Diagnosis not present

## 2021-12-06 DIAGNOSIS — I10 Essential (primary) hypertension: Secondary | ICD-10-CM | POA: Diagnosis not present

## 2021-12-06 DIAGNOSIS — E782 Mixed hyperlipidemia: Secondary | ICD-10-CM | POA: Diagnosis not present

## 2021-12-06 DIAGNOSIS — E119 Type 2 diabetes mellitus without complications: Secondary | ICD-10-CM | POA: Diagnosis not present

## 2021-12-15 DIAGNOSIS — I10 Essential (primary) hypertension: Secondary | ICD-10-CM | POA: Diagnosis not present

## 2021-12-15 DIAGNOSIS — R7302 Impaired glucose tolerance (oral): Secondary | ICD-10-CM | POA: Diagnosis not present

## 2021-12-15 DIAGNOSIS — Z23 Encounter for immunization: Secondary | ICD-10-CM | POA: Diagnosis not present

## 2021-12-15 DIAGNOSIS — E782 Mixed hyperlipidemia: Secondary | ICD-10-CM | POA: Diagnosis not present

## 2021-12-15 DIAGNOSIS — E559 Vitamin D deficiency, unspecified: Secondary | ICD-10-CM | POA: Diagnosis not present

## 2021-12-15 DIAGNOSIS — I63532 Cerebral infarction due to unspecified occlusion or stenosis of left posterior cerebral artery: Secondary | ICD-10-CM | POA: Diagnosis not present

## 2021-12-15 DIAGNOSIS — Z739 Problem related to life management difficulty, unspecified: Secondary | ICD-10-CM | POA: Diagnosis not present

## 2022-03-07 DIAGNOSIS — E119 Type 2 diabetes mellitus without complications: Secondary | ICD-10-CM

## 2022-03-07 DIAGNOSIS — E782 Mixed hyperlipidemia: Secondary | ICD-10-CM

## 2022-03-07 DIAGNOSIS — I10 Essential (primary) hypertension: Secondary | ICD-10-CM

## 2022-03-17 ENCOUNTER — Encounter: Payer: Self-pay | Admitting: Internal Medicine

## 2022-03-17 ENCOUNTER — Other Ambulatory Visit: Payer: Self-pay

## 2022-03-17 ENCOUNTER — Ambulatory Visit (INDEPENDENT_AMBULATORY_CARE_PROVIDER_SITE_OTHER): Payer: Medicare HMO | Admitting: Internal Medicine

## 2022-03-17 VITALS — BP 128/72 | HR 88 | Ht 62.0 in | Wt 228.0 lb

## 2022-03-17 DIAGNOSIS — I10 Essential (primary) hypertension: Secondary | ICD-10-CM | POA: Diagnosis not present

## 2022-03-17 DIAGNOSIS — E559 Vitamin D deficiency, unspecified: Secondary | ICD-10-CM | POA: Diagnosis not present

## 2022-03-17 DIAGNOSIS — R7302 Impaired glucose tolerance (oral): Secondary | ICD-10-CM | POA: Diagnosis not present

## 2022-03-17 DIAGNOSIS — E782 Mixed hyperlipidemia: Secondary | ICD-10-CM | POA: Diagnosis not present

## 2022-03-17 DIAGNOSIS — Z1231 Encounter for screening mammogram for malignant neoplasm of breast: Secondary | ICD-10-CM | POA: Diagnosis not present

## 2022-03-17 DIAGNOSIS — J3089 Other allergic rhinitis: Secondary | ICD-10-CM | POA: Insufficient documentation

## 2022-03-17 DIAGNOSIS — J301 Allergic rhinitis due to pollen: Secondary | ICD-10-CM | POA: Diagnosis not present

## 2022-03-17 DIAGNOSIS — Z1211 Encounter for screening for malignant neoplasm of colon: Secondary | ICD-10-CM | POA: Diagnosis not present

## 2022-03-17 LAB — POCT CBG (FASTING - GLUCOSE)-MANUAL ENTRY: Glucose Fasting, POC: 104 mg/dL — AB (ref 70–99)

## 2022-03-17 NOTE — Progress Notes (Signed)
Established Patient Office Visit  Subjective:  Patient ID: Karen Chan, female    DOB: 1950/03/31  Age: 72 y.o. MRN: [id number]  Chief Complaint  Patient presents with   Follow-up    3 month follow up    Patient comes in for her 1-monthfollow-up accompanied by her husband.  She generally feels well and offers no new complaints.  She has still not lost any weight but agrees to start an exercise program.  Her blood pressure is better this time.  She is fasting for blood work.  Patient has still not done her mammogram as was scheduled at her last visit.  And also has not completed her Cologuard test. Today she agrees that she will go to her mammogram once I reschedule it.  And also agrees to get actually get a colonoscopy done to screen for colon cancer and polyps.     Past Medical History:  Diagnosis Date   Acute ischemic left posterior cerebral artery (PCA) stroke (HCC)    Essential hypertension, benign    Impaired glucose tolerance in obese    Mixed hyperlipidemia    Non-seasonal allergic rhinitis    Stroke ([place]    Vitamin D deficiency     Social History   Socioeconomic History   Marital status: Married    Spouse name: Not on file   Number of children: Not on file   Years of education: Not on file   Highest education level: Not on file  Occupational History   Not on file  Tobacco Use   Smoking status: Never   Smokeless tobacco: Never  Vaping Use   Vaping Use: Never used  Substance and Sexual Activity   Alcohol use: Never   Drug use: Never   Sexual activity: Not Currently  Other Topics Concern   Not on file  Social History Narrative   Not on file   Social Determinants of Health   Financial Resource Strain: Not on file  Food Insecurity: No Food Insecurity (09/15/2021)   Hunger Vital Sign    Worried About Running Out of Food in the Last Year: Never true    Ran Out of Food in the Last Year: Never true  Transportation Needs: No Transportation Needs  (09/15/2021)   PRAPARE - THydrologist(Medical): No    Lack of Transportation (Non-Medical): No  Physical Activity: Not on file  Stress: Not on file  Social Connections: Not on file  Intimate Partner Violence: Not on file    Family History  Problem Relation Age of Onset   Hypertension Mother    Coronary artery disease Father    Breast cancer Neg Hx     No Known Allergies  Review of Systems  Constitutional:  Negative for chills, fever, malaise/fatigue and weight loss.  HENT: Negative.    Eyes: Negative.   Respiratory:  Negative for cough, shortness of breath and wheezing.   Cardiovascular:  Positive for chest pain, palpitations and orthopnea. Negative for leg swelling.  Gastrointestinal:  Negative for abdominal pain, blood in stool, constipation, diarrhea, heartburn, melena, nausea and vomiting.  Genitourinary:  Negative for dysuria, frequency and urgency.  Musculoskeletal:  Negative for back pain, joint pain, myalgias and neck pain.  Skin: Negative.   Neurological:  Negative for tingling, tremors, sensory change, speech change, focal weakness, weakness and headaches.  Endo/Heme/Allergies: Negative.   Psychiatric/Behavioral: Negative.         Objective:   BP 128/72   Pulse  88   Ht 5' 2"$  (1.575 m)   Wt 228 lb (103.4 kg)   SpO2 95%   BMI 41.70 kg/m   Vitals:   03/17/22 1024  BP: 128/72  Pulse: 88  Height: 5' 2"$  (1.575 m)  Weight: 228 lb (103.4 kg)  SpO2: 95%  BMI (Calculated): 41.69    Physical Exam Vitals and nursing note reviewed.  Constitutional:      Appearance: Normal appearance.  HENT:     Head: Normocephalic and atraumatic.  Cardiovascular:     Rate and Rhythm: Normal rate and regular rhythm.     Pulses: Normal pulses.     Heart sounds: Normal heart sounds.  Pulmonary:     Effort: Pulmonary effort is normal.     Breath sounds: Normal breath sounds.  Abdominal:     General: Abdomen is flat. Bowel sounds are normal.      Palpations: Abdomen is soft.  Musculoskeletal:        General: Normal range of motion.     Cervical back: Normal range of motion.  Skin:    General: Skin is warm.  Neurological:     General: No focal deficit present.     Mental Status: She is alert and oriented to person, place, and time.  Psychiatric:        Mood and Affect: Mood normal.        Behavior: Behavior normal.      Results for orders placed or performed in visit on 03/17/22  POCT CBG (Fasting - Glucose)  Result Value Ref Range   Glucose Fasting, POC 104 (A) 70 - 99 mg/dL    Recent Results (from the past 2160 hour(s))  POCT CBG (Fasting - Glucose)     Status: Abnormal   Collection Time: 03/17/22 10:27 AM  Result Value Ref Range   Glucose Fasting, POC 104 (A) 70 - 99 mg/dL      Assessment & Plan:   Problem List Items Addressed This Visit     Essential hypertension, benign   Relevant Medications   carvedilol (COREG) 25 MG tablet   Other Relevant Orders   COMPLETE METABOLIC PANEL WITH GFR   CBC With Differential   Vitamin D deficiency   Relevant Orders   Vitamin D (25 hydroxy)   Impaired glucose tolerance in obese - Primary   Relevant Orders   POCT CBG (Fasting - Glucose) (Completed)   Hemoglobin A1c   Non-seasonal allergic rhinitis   Other Visit Diagnoses     Mixed hyperlipidemia       Relevant Medications   carvedilol (COREG) 25 MG tablet   Other Relevant Orders   Lipid Panel w/o Chol/HDL Ratio   Encounter for screening mammogram for malignant neoplasm of breast       Relevant Orders   MM DIGITAL SCREENING BILATERAL   Screening for colon cancer       Relevant Orders   Ambulatory referral to Gastroenterology       @VISPATINSTR$ @  Return in about 3 months (around 06/15/2022).   Total time spent: 30 minutes  Perrin Maltese, MD  03/17/2022

## 2022-03-18 LAB — HEMOGLOBIN A1C
Est. average glucose Bld gHb Est-mCnc: 123 mg/dL
Hgb A1c MFr Bld: 5.9 % — ABNORMAL HIGH (ref 4.8–5.6)

## 2022-03-18 LAB — CBC WITH DIFFERENTIAL
Basophils Absolute: 0 10*3/uL (ref 0.0–0.2)
Basos: 0 %
EOS (ABSOLUTE): 0.2 10*3/uL (ref 0.0–0.4)
Eos: 2 %
Hematocrit: 37.6 % (ref 34.0–46.6)
Hemoglobin: 12.3 g/dL (ref 11.1–15.9)
Immature Grans (Abs): 0 10*3/uL (ref 0.0–0.1)
Immature Granulocytes: 0 %
Lymphocytes Absolute: 0.4 10*3/uL — ABNORMAL LOW (ref 0.7–3.1)
Lymphs: 6 %
MCH: 31.5 pg (ref 26.6–33.0)
MCHC: 32.7 g/dL (ref 31.5–35.7)
MCV: 96 fL (ref 79–97)
Monocytes Absolute: 0.4 10*3/uL (ref 0.1–0.9)
Monocytes: 6 %
Neutrophils Absolute: 6.1 10*3/uL (ref 1.4–7.0)
Neutrophils: 86 %
RBC: 3.91 x10E6/uL (ref 3.77–5.28)
RDW: 12.9 % (ref 11.7–15.4)
WBC: 7.1 10*3/uL (ref 3.4–10.8)

## 2022-03-18 LAB — LIPID PANEL W/O CHOL/HDL RATIO
Cholesterol, Total: 189 mg/dL (ref 100–199)
HDL: 99 mg/dL (ref >39)
LDL Chol Calc (NIH): 80 mg/dL (ref 0–99)
Triglycerides: 53 mg/dL (ref 0–149)
VLDL Cholesterol Cal: 10 mg/dL (ref 5–40)

## 2022-03-18 LAB — VITAMIN D 25 HYDROXY (VIT D DEFICIENCY, FRACTURES): Vit D, 25-Hydroxy: 13.4 ng/mL — ABNORMAL LOW (ref 30.0–100.0)

## 2022-03-22 IMAGING — CR DG KNEE 1-2V*R*
2 series · 2 of 2 positions shown · non-contrast
Comparison: None.

CLINICAL DATA: 70-year-old female status post fall with right knee
and ankle pain.

EXAM:
RIGHT KNEE - 1-2 VIEW

[knee ap]
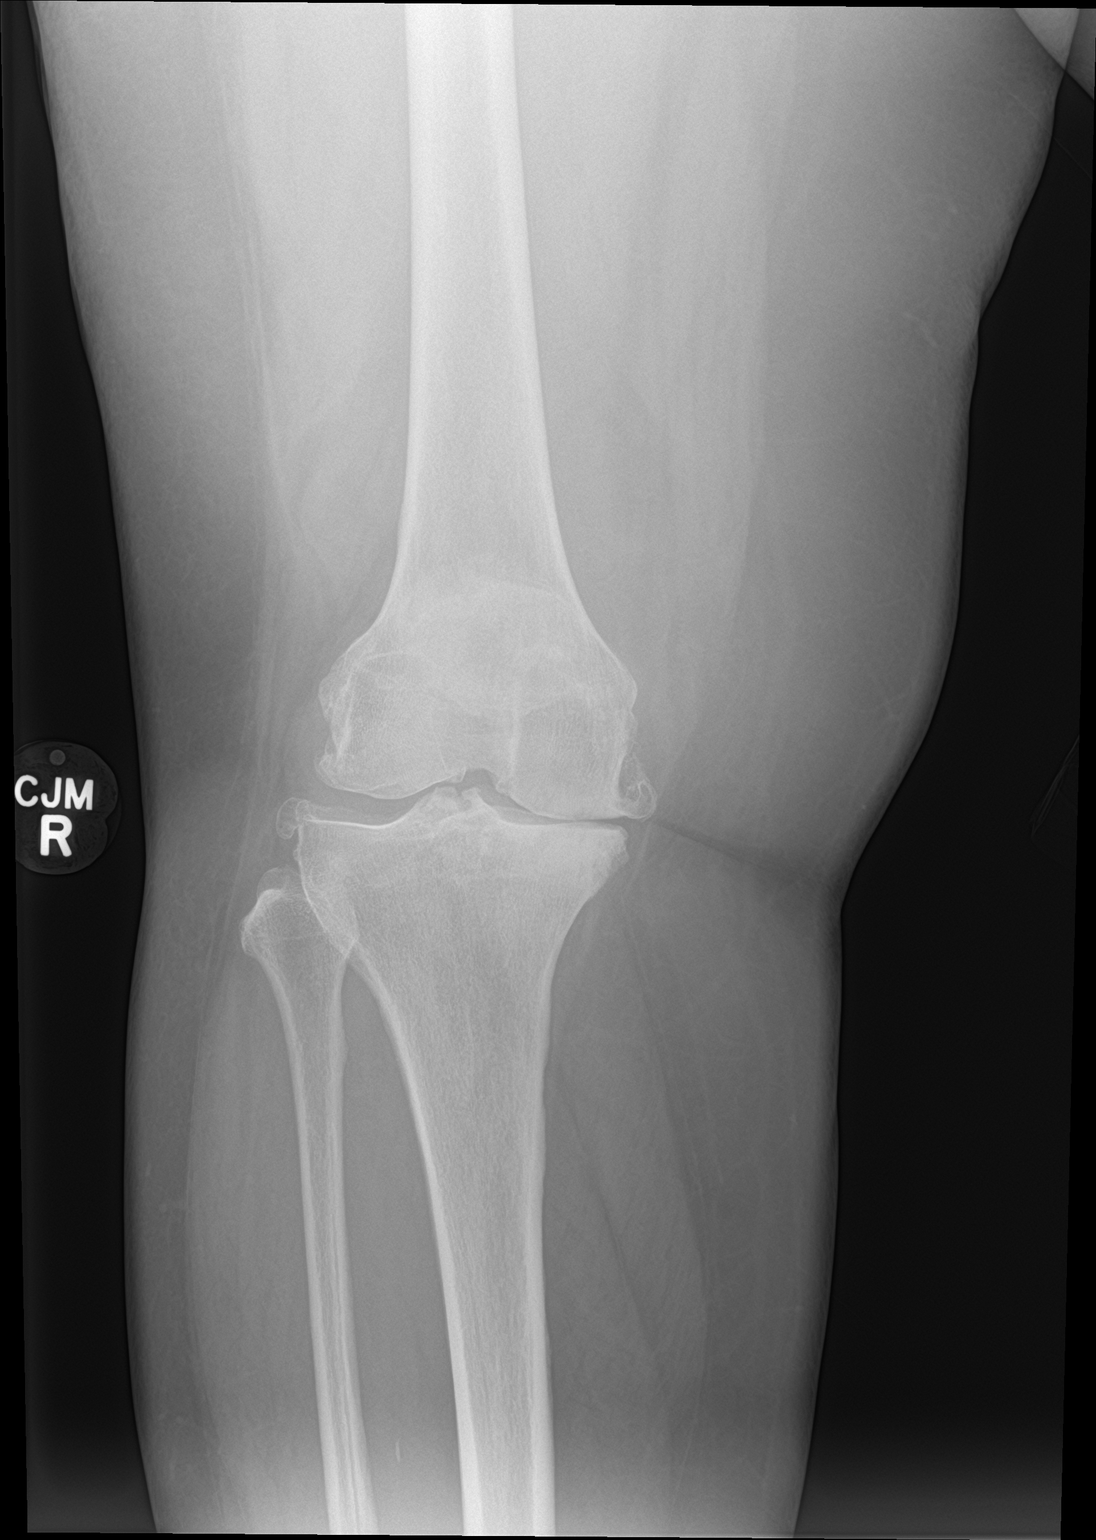

[knee lat]
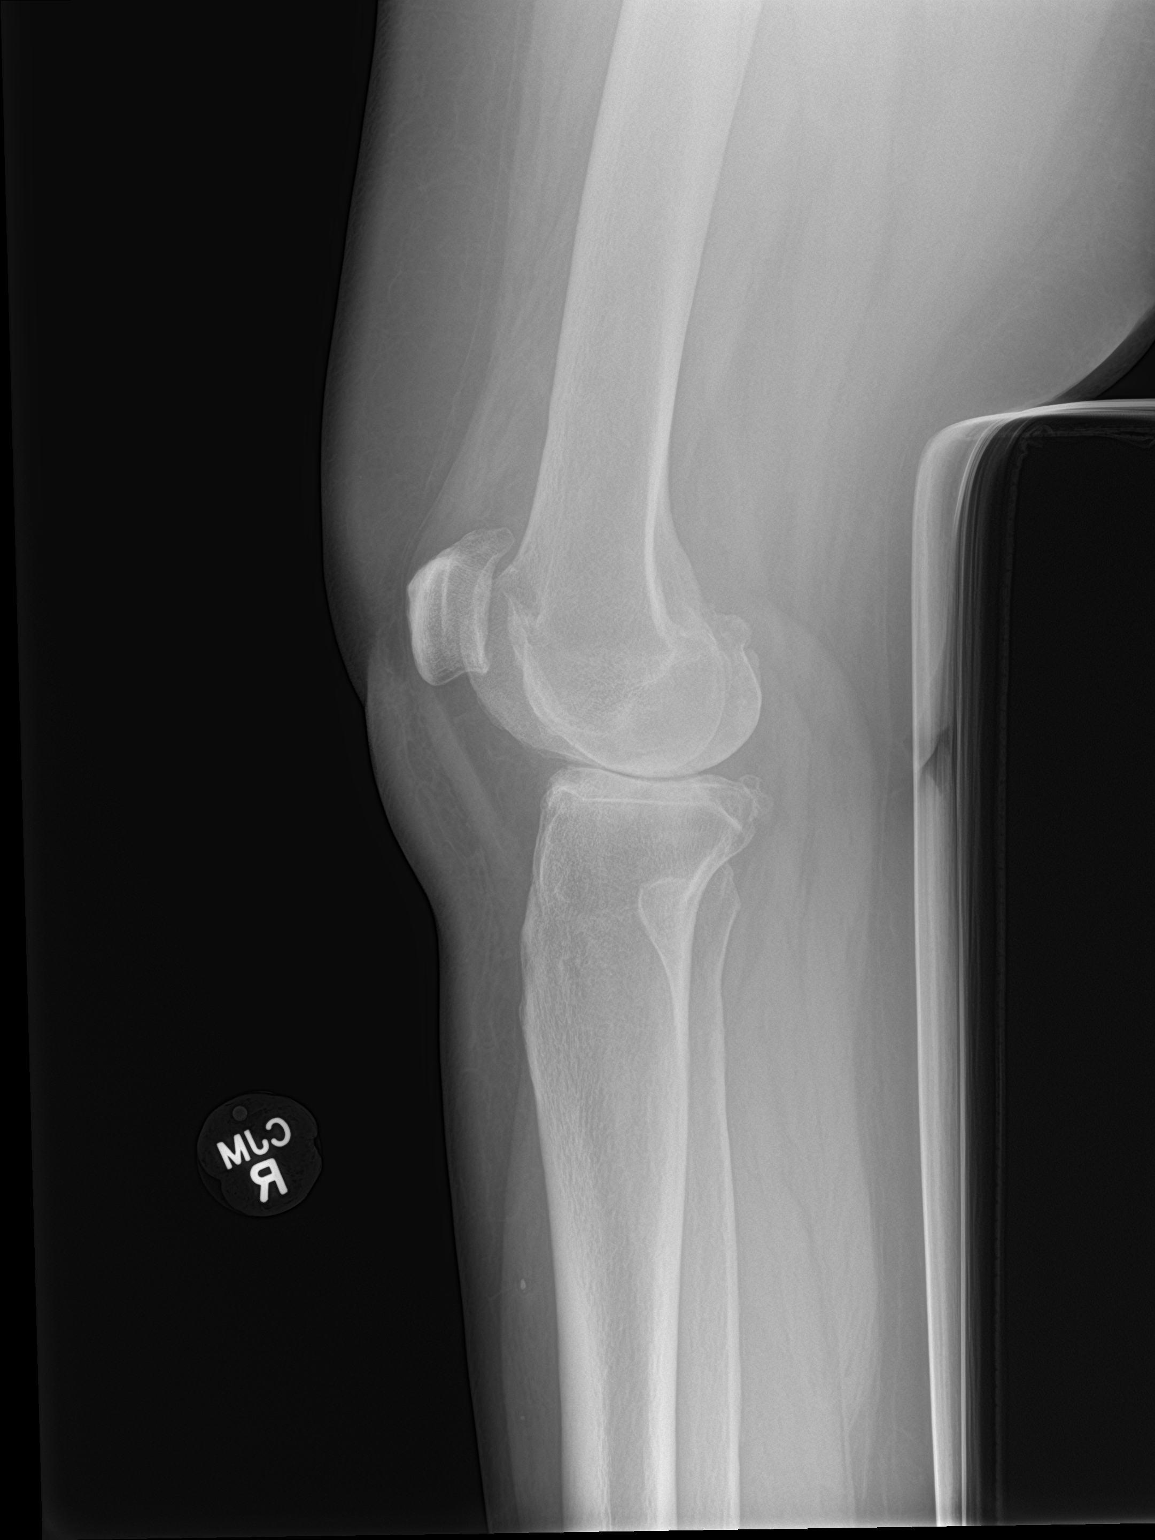

[2 of 2 positions shown; findings below may reference images not displayed]

FINDINGS: AP and cross-table lateral views. Bulky tricompartmental
degenerative spurring. Severe medial compartment joint space loss.
No joint effusion. Patella appears intact. No acute osseous
abnormality identified. No discrete soft tissue injury identified.
IMPRESSION: No acute osseous abnormality identified. Severe tricompartmental
degenerative changes, severe in the medial compartment.

## 2022-04-17 ENCOUNTER — Encounter: Payer: Self-pay | Admitting: Internal Medicine

## 2022-05-23 DIAGNOSIS — H524 Presbyopia: Secondary | ICD-10-CM | POA: Diagnosis not present

## 2022-05-23 DIAGNOSIS — H04123 Dry eye syndrome of bilateral lacrimal glands: Secondary | ICD-10-CM | POA: Diagnosis not present

## 2022-05-23 DIAGNOSIS — H5203 Hypermetropia, bilateral: Secondary | ICD-10-CM | POA: Diagnosis not present

## 2022-05-23 DIAGNOSIS — H259 Unspecified age-related cataract: Secondary | ICD-10-CM | POA: Diagnosis not present

## 2022-05-23 DIAGNOSIS — H52223 Regular astigmatism, bilateral: Secondary | ICD-10-CM | POA: Diagnosis not present

## 2022-05-25 DIAGNOSIS — H524 Presbyopia: Secondary | ICD-10-CM | POA: Diagnosis not present

## 2022-05-25 DIAGNOSIS — H52222 Regular astigmatism, left eye: Secondary | ICD-10-CM | POA: Diagnosis not present

## 2022-06-01 ENCOUNTER — Other Ambulatory Visit: Payer: Self-pay | Admitting: Internal Medicine

## 2022-06-01 DIAGNOSIS — I10 Essential (primary) hypertension: Secondary | ICD-10-CM

## 2022-06-08 ENCOUNTER — Other Ambulatory Visit: Payer: Self-pay | Admitting: Internal Medicine

## 2022-06-08 DIAGNOSIS — E559 Vitamin D deficiency, unspecified: Secondary | ICD-10-CM

## 2022-06-08 MED ORDER — VITAMIN D (ERGOCALCIFEROL) 1.25 MG (50000 UNIT) PO CAPS: 50000.0000 [IU] | ORAL_CAPSULE | ORAL | 3 refills | Status: DC

## 2022-06-15 ENCOUNTER — Ambulatory Visit: Payer: Medicare HMO | Admitting: Internal Medicine

## 2022-06-19 ENCOUNTER — Other Ambulatory Visit: Payer: Self-pay | Admitting: Internal Medicine

## 2022-06-20 ENCOUNTER — Ambulatory Visit (INDEPENDENT_AMBULATORY_CARE_PROVIDER_SITE_OTHER): Payer: Medicare HMO | Admitting: Internal Medicine

## 2022-06-20 ENCOUNTER — Other Ambulatory Visit: Payer: Self-pay | Admitting: Internal Medicine

## 2022-06-20 ENCOUNTER — Encounter: Payer: Self-pay | Admitting: Internal Medicine

## 2022-06-20 VITALS — BP 124/70 | HR 90 | Ht 62.0 in | Wt 233.0 lb

## 2022-06-20 DIAGNOSIS — E782 Mixed hyperlipidemia: Secondary | ICD-10-CM

## 2022-06-20 DIAGNOSIS — E559 Vitamin D deficiency, unspecified: Secondary | ICD-10-CM

## 2022-06-20 DIAGNOSIS — E1165 Type 2 diabetes mellitus with hyperglycemia: Secondary | ICD-10-CM | POA: Diagnosis not present

## 2022-06-20 DIAGNOSIS — R7302 Impaired glucose tolerance (oral): Secondary | ICD-10-CM | POA: Diagnosis not present

## 2022-06-20 DIAGNOSIS — I10 Essential (primary) hypertension: Secondary | ICD-10-CM | POA: Diagnosis not present

## 2022-06-20 LAB — POCT CBG (FASTING - GLUCOSE)-MANUAL ENTRY: Glucose Fasting, POC: 154 mg/dL — AB (ref 70–99)

## 2022-06-20 MED ORDER — SEMAGLUTIDE-WEIGHT MANAGEMENT 0.25 MG/0.5ML ~~LOC~~ SOAJ: 0.2500 mg | SUBCUTANEOUS | 0 refills | Status: DC

## 2022-06-20 NOTE — Progress Notes (Signed)
Established Patient Office Visit  Subjective:  Patient ID: Karen Chan, female    DOB: 15-Aug-1950  Age: 72 y.o. MRN: [id number]  Chief Complaint  Patient presents with   Follow-up    3 month follow up    Patient comes in for follow-up with her daughter today.  She says she is feeling quite well but has gained some weight.  Although her balance and her gait has improved and she is able to walk some more but still she is not very physically active. Consider Wegovy injections as patient has glucose intolerance as well as obesity.  Will send in a prescription and see if it gets covered by her insurance. Patient still has not done her mammogram or Cologuard yet.  Patient will be getting fasting blood work.    No other concerns at this time.   Past Medical History:  Diagnosis Date   Acute ischemic left posterior cerebral artery (PCA) stroke (HCC)    Essential hypertension, benign    Impaired glucose tolerance in obese    Mixed hyperlipidemia    Non-seasonal allergic rhinitis    Stroke (HCC)    Vitamin D deficiency     Past Surgical History:  Procedure Laterality Date   lateral malleolar fracture Right     Social History   Socioeconomic History   Marital status: Married    Spouse name: Not on file   Number of children: Not on file   Years of education: Not on file   Highest education level: Not on file  Occupational History   Not on file  Tobacco Use   Smoking status: Never   Smokeless tobacco: Never  Vaping Use   Vaping Use: Never used  Substance and Sexual Activity   Alcohol use: Never   Drug use: Never   Sexual activity: Not Currently  Other Topics Concern   Not on file  Social History Narrative   Not on file   Social Determinants of Health   Financial Resource Strain: Not on file  Food Insecurity: No Food Insecurity (09/15/2021)   Hunger Vital Sign    Worried About Running Out of Food in the Last Year: Never true    Ran Out of Food in the Last  Year: Never true  Transportation Needs: No Transportation Needs (09/15/2021)   PRAPARE - Administrator, Civil Service (Medical): No    Lack of Transportation (Non-Medical): No  Physical Activity: Not on file  Stress: Not on file  Social Connections: Not on file  Intimate Partner Violence: Not on file    Family History  Problem Relation Age of Onset   Hypertension Mother    Coronary artery disease Father    Breast cancer Neg Hx     No Known Allergies  Review of Systems  Constitutional:  Negative for chills, fever and malaise/fatigue.  HENT: Negative.  Negative for hearing loss and tinnitus.   Eyes: Negative.   Respiratory:  Negative for cough, shortness of breath and wheezing.   Cardiovascular:  Negative for chest pain, palpitations, orthopnea and leg swelling.  Gastrointestinal:  Negative for abdominal pain, blood in stool, heartburn, nausea and vomiting.  Genitourinary:  Negative for dysuria, frequency and urgency.  Musculoskeletal:  Negative for back pain, falls, joint pain, myalgias and neck pain.  Neurological:  Positive for weakness. Negative for dizziness, tingling, tremors, sensory change, speech change, focal weakness and headaches.  Psychiatric/Behavioral:  Negative for depression. The patient is not nervous/anxious.  Objective:   BP 124/70   Pulse 90   Ht 5\' 2"  (1.575 m)   Wt 233 lb (105.7 kg)   SpO2 98%   BMI 42.62 kg/m   Vitals:   06/20/22 1016  BP: 124/70  Pulse: 90  Height: 5\' 2"  (1.575 m)  Weight: 233 lb (105.7 kg)  SpO2: 98%  BMI (Calculated): 42.61    Physical Exam Vitals and nursing note reviewed.  Constitutional:      Appearance: Normal appearance. She is obese.  Cardiovascular:     Rate and Rhythm: Normal rate and regular rhythm.     Pulses: Normal pulses.     Heart sounds: Normal heart sounds.  Pulmonary:     Effort: Pulmonary effort is normal.     Breath sounds: Normal breath sounds. No wheezing or rales.   Abdominal:     General: Abdomen is flat. Bowel sounds are normal. There is no distension.     Tenderness: There is no abdominal tenderness.  Musculoskeletal:        General: No swelling, tenderness, deformity or signs of injury.     Cervical back: Normal range of motion.     Right lower leg: No edema.     Left lower leg: No edema.  Skin:    General: Skin is warm and dry.  Neurological:     Mental Status: She is alert and oriented to person, place, and time.     Motor: Weakness (right leg from stroke) present.      Results for orders placed or performed in visit on 06/20/22  POCT CBG (Fasting - Glucose)  Result Value Ref Range   Glucose Fasting, POC 154 (A) 70 - 99 mg/dL    Recent Results (from the past 2160 hour(s))  POCT CBG (Fasting - Glucose)     Status: Abnormal   Collection Time: 06/20/22 10:23 AM  Result Value Ref Range   Glucose Fasting, POC 154 (A) 70 - 99 mg/dL    Comment: non-fasting      Assessment & Plan:  Fasting blood work.  Patient will get a mammogram and Cologuard.  Trial of Wegovy injections at 0.25 mg/week.  Strict diet control emphasized .Patient will be seen in 4 weeks to see an improvement. Problem List Items Addressed This Visit     Obesity, Class III, BMI 40-49.9 (morbid obesity) (HCC)   Relevant Medications   Semaglutide-Weight Management 0.25 MG/0.5ML SOAJ   Essential hypertension, benign   Relevant Orders   CBC With Differential   CMP14+EGFR   Vitamin D deficiency   Relevant Orders   Vitamin D (25 hydroxy)   Impaired glucose tolerance in obese   Relevant Medications   Semaglutide-Weight Management 0.25 MG/0.5ML SOAJ   Other Relevant Orders   Hemoglobin A1c   Other Visit Diagnoses     Type 2 diabetes mellitus with hyperglycemia, without long-term current use of insulin (HCC)    -  Primary   Relevant Orders   POCT CBG (Fasting - Glucose) (Completed)   Mixed hyperlipidemia       Relevant Orders   Lipid Panel w/o Chol/HDL Ratio        Return in about 4 weeks (around 07/18/2022).   Total time spent: 30 minutes  Margaretann Loveless, MD  06/20/2022

## 2022-06-21 ENCOUNTER — Other Ambulatory Visit: Payer: Medicare HMO

## 2022-06-21 DIAGNOSIS — E782 Mixed hyperlipidemia: Secondary | ICD-10-CM | POA: Diagnosis not present

## 2022-06-21 DIAGNOSIS — R7302 Impaired glucose tolerance (oral): Secondary | ICD-10-CM | POA: Diagnosis not present

## 2022-06-21 DIAGNOSIS — I10 Essential (primary) hypertension: Secondary | ICD-10-CM | POA: Diagnosis not present

## 2022-06-21 DIAGNOSIS — E559 Vitamin D deficiency, unspecified: Secondary | ICD-10-CM | POA: Diagnosis not present

## 2022-06-22 LAB — CMP14+EGFR
ALT: 8 IU/L (ref 0–32)
AST: 16 IU/L (ref 0–40)
Albumin/Globulin Ratio: 1.3 (ref 1.2–2.2)
Albumin: 4.5 g/dL (ref 3.9–4.9)
Alkaline Phosphatase: 82 IU/L (ref 44–121)
BUN/Creatinine Ratio: 18 (ref 12–28)
BUN: 22 mg/dL (ref 8–27)
Bilirubin Total: 0.6 mg/dL (ref 0.0–1.2)
CO2: 24 mmol/L (ref 20–29)
Calcium: 9.6 mg/dL (ref 8.7–10.3)
Chloride: 100 mmol/L (ref 96–106)
Creatinine, Ser: 1.2 mg/dL — ABNORMAL HIGH (ref 0.57–1.00)
Globulin, Total: 3.4 g/dL (ref 1.5–4.5)
Glucose: 104 mg/dL — ABNORMAL HIGH (ref 70–99)
Potassium: 4 mmol/L (ref 3.5–5.2)
Sodium: 140 mmol/L (ref 134–144)
Total Protein: 7.9 g/dL (ref 6.0–8.5)
eGFR: 49 mL/min/{1.73_m2} — ABNORMAL LOW (ref >59)

## 2022-06-22 LAB — CBC WITH DIFFERENTIAL
Basophils Absolute: 0.1 10*3/uL (ref 0.0–0.2)
Basos: 1 %
EOS (ABSOLUTE): 0.1 10*3/uL (ref 0.0–0.4)
Eos: 2 %
Hematocrit: 36.7 % (ref 34.0–46.6)
Hemoglobin: 12.5 g/dL (ref 11.1–15.9)
Immature Grans (Abs): 0 10*3/uL (ref 0.0–0.1)
Immature Granulocytes: 0 %
Lymphocytes Absolute: 1.3 10*3/uL (ref 0.7–3.1)
Lymphs: 23 %
MCH: 32.3 pg (ref 26.6–33.0)
MCHC: 34.1 g/dL (ref 31.5–35.7)
MCV: 95 fL (ref 79–97)
Monocytes Absolute: 0.5 10*3/uL (ref 0.1–0.9)
Monocytes: 9 %
Neutrophils Absolute: 3.6 10*3/uL (ref 1.4–7.0)
Neutrophils: 65 %
RBC: 3.87 x10E6/uL (ref 3.77–5.28)
RDW: 12.7 % (ref 11.7–15.4)
WBC: 5.6 10*3/uL (ref 3.4–10.8)

## 2022-06-22 LAB — HEMOGLOBIN A1C
Est. average glucose Bld gHb Est-mCnc: 120 mg/dL
Hgb A1c MFr Bld: 5.8 % — ABNORMAL HIGH (ref 4.8–5.6)

## 2022-06-22 LAB — LIPID PANEL W/O CHOL/HDL RATIO
Cholesterol, Total: 207 mg/dL — ABNORMAL HIGH (ref 100–199)
HDL: 102 mg/dL (ref >39)
LDL Chol Calc (NIH): 93 mg/dL (ref 0–99)
Triglycerides: 70 mg/dL (ref 0–149)
VLDL Cholesterol Cal: 12 mg/dL (ref 5–40)

## 2022-06-22 LAB — VITAMIN D 25 HYDROXY (VIT D DEFICIENCY, FRACTURES): Vit D, 25-Hydroxy: 33.4 ng/mL (ref 30.0–100.0)

## 2022-07-18 ENCOUNTER — Ambulatory Visit: Payer: Medicare HMO | Admitting: Internal Medicine

## 2022-07-27 ENCOUNTER — Ambulatory Visit
Admission: RE | Admit: 2022-07-27 | Discharge: 2022-07-27 | Disposition: A | Discharge status: Home / Self Care | Payer: Medicare HMO | Source: Ambulatory Visit | Attending: Internal Medicine | Admitting: Internal Medicine

## 2022-07-27 ENCOUNTER — Ambulatory Visit: Payer: Medicare HMO | Admitting: Internal Medicine

## 2022-07-27 DIAGNOSIS — Z1231 Encounter for screening mammogram for malignant neoplasm of breast: Secondary | ICD-10-CM | POA: Diagnosis not present

## 2022-08-18 ENCOUNTER — Telehealth: Payer: Self-pay | Admitting: Internal Medicine

## 2022-08-18 NOTE — Telephone Encounter (Signed)
Patient's husband called to see which medication she is supposed to be taking twice daily. Advised him it is carvedilol.

## 2022-10-16 ENCOUNTER — Other Ambulatory Visit: Payer: Self-pay

## 2022-10-16 ENCOUNTER — Telehealth: Payer: Self-pay | Admitting: Internal Medicine

## 2022-10-16 DIAGNOSIS — E559 Vitamin D deficiency, unspecified: Secondary | ICD-10-CM

## 2022-10-16 MED ORDER — VITAMIN D (ERGOCALCIFEROL) 1.25 MG (50000 UNIT) PO CAPS
50000.0000 [IU] | ORAL_CAPSULE | ORAL | 3 refills | Status: AC
Start: 2022-10-16 — End: ?

## 2022-10-16 NOTE — Telephone Encounter (Signed)
Spouse requested for patient Vitamin D be refilled; Request put in.

## 2022-10-16 NOTE — Telephone Encounter (Signed)
Patient's spouse left VM wanting a call back to discuss the patient's medication.

## 2022-11-12 ENCOUNTER — Other Ambulatory Visit: Payer: Self-pay | Admitting: Internal Medicine

## 2022-11-12 DIAGNOSIS — I10 Essential (primary) hypertension: Secondary | ICD-10-CM

## 2022-12-15 ENCOUNTER — Telehealth: Payer: Self-pay | Admitting: Internal Medicine

## 2022-12-15 NOTE — Telephone Encounter (Signed)
Patient's husband left VM requesting a call back. Did not state what is needed.

## 2022-12-20 ENCOUNTER — Ambulatory Visit
Admission: RE | Admit: 2022-12-20 | Discharge: 2022-12-20 | Disposition: A | Discharge status: Home / Self Care | Payer: Medicare HMO | Attending: Cardiology | Admitting: Cardiology

## 2022-12-20 ENCOUNTER — Ambulatory Visit
Admission: RE | Admit: 2022-12-20 | Discharge: 2022-12-20 | Disposition: A | Discharge status: Home / Self Care | Payer: Medicare HMO | Source: Ambulatory Visit | Attending: Cardiology | Admitting: Cardiology

## 2022-12-20 ENCOUNTER — Ambulatory Visit (INDEPENDENT_AMBULATORY_CARE_PROVIDER_SITE_OTHER): Payer: Medicare HMO | Admitting: Cardiology

## 2022-12-20 ENCOUNTER — Encounter: Payer: Self-pay | Admitting: Cardiology

## 2022-12-20 VITALS — BP 160/100 | HR 84 | Ht 63.0 in | Wt 240.0 lb

## 2022-12-20 DIAGNOSIS — M25512 Pain in left shoulder: Secondary | ICD-10-CM | POA: Insufficient documentation

## 2022-12-20 DIAGNOSIS — M19012 Primary osteoarthritis, left shoulder: Secondary | ICD-10-CM | POA: Diagnosis not present

## 2022-12-20 MED ORDER — MELOXICAM 7.5 MG PO TABS: 7.5000 mg | ORAL_TABLET | Freq: Every day | ORAL | 0 refills | Status: DC

## 2022-12-20 NOTE — Progress Notes (Signed)
Established Patient Office Visit  Subjective:  Patient ID: Karen Chan, female    DOB: 1950-07-30  Age: 72 y.o. MRN: [id number]  Chief Complaint  Patient presents with   Acute Visit    Pain in arm    Patient in office for an acute visit, complaining of left arm pain, started last week. Patient states her upper arm, shoulder started hurting last week, took some tylenol with relief, pain returned. Will order an xray. Mobic for pain.   Arm Pain  The incident occurred 5 to 7 days ago. There was no injury mechanism. The pain is present in the left shoulder. The quality of the pain is described as aching. The pain radiates to the back. The pain is mild. The pain has been Intermittent since the incident. Pertinent negatives include no chest pain, numbness or tingling. The symptoms are aggravated by movement and lifting. She has tried acetaminophen for the symptoms. The treatment provided mild relief.    No other concerns at this time.   Past Medical History:  Diagnosis Date   Acute ischemic left posterior cerebral artery (PCA) stroke (HCC)    Essential hypertension, benign    Impaired glucose tolerance in obese    Mixed hyperlipidemia    Non-seasonal allergic rhinitis    Stroke (HCC)    Vitamin D deficiency     Past Surgical History:  Procedure Laterality Date   lateral malleolar fracture Right     Social History   Socioeconomic History   Marital status: Married    Spouse name: Not on file   Number of children: Not on file   Years of education: Not on file   Highest education level: Not on file  Occupational History   Not on file  Tobacco Use   Smoking status: Never   Smokeless tobacco: Never  Vaping Use   Vaping status: Never Used  Substance and Sexual Activity   Alcohol use: Never   Drug use: Never   Sexual activity: Not Currently  Other Topics Concern   Not on file  Social History Narrative   Not on file   Social Determinants of Health   Financial  Resource Strain: Not on file  Food Insecurity: No Food Insecurity (09/15/2021)   Hunger Vital Sign    Worried About Running Out of Food in the Last Year: Never true    Ran Out of Food in the Last Year: Never true  Transportation Needs: No Transportation Needs (09/15/2021)   PRAPARE - Administrator, Civil Service (Medical): No    Lack of Transportation (Non-Medical): No  Physical Activity: Not on file  Stress: Not on file  Social Connections: Not on file  Intimate Partner Violence: Not on file    Family History  Problem Relation Age of Onset   Hypertension Mother    Coronary artery disease Father    Breast cancer Neg Hx     No Known Allergies  Review of Systems  Constitutional: Negative.   HENT: Negative.    Eyes: Negative.   Respiratory: Negative.  Negative for shortness of breath.   Cardiovascular: Negative.  Negative for chest pain.  Gastrointestinal: Negative.  Negative for abdominal pain, constipation and diarrhea.  Genitourinary: Negative.   Musculoskeletal:  Negative for joint pain and myalgias.  Skin: Negative.   Neurological: Negative.  Negative for dizziness, tingling, numbness and headaches.  Endo/Heme/Allergies: Negative.   All other systems reviewed and are negative.      Objective:  BP (!) 160/100   Pulse 84   Ht 5\' 3"  (1.6 m)   Wt 240 lb (108.9 kg)   SpO2 98%   BMI 42.51 kg/m   Vitals:   12/20/22 0916  BP: (!) 160/100  Pulse: 84  Height: 5\' 3"  (1.6 m)  Weight: 240 lb (108.9 kg)  SpO2: 98%  BMI (Calculated): 42.52    Physical Exam Vitals and nursing note reviewed.  Constitutional:      Appearance: Normal appearance. She is normal weight.  HENT:     Head: Normocephalic and atraumatic.     Nose: Nose normal.     Mouth/Throat:     Mouth: Mucous membranes are moist.  Eyes:     Extraocular Movements: Extraocular movements intact.     Conjunctiva/sclera: Conjunctivae normal.     Pupils: Pupils are equal, round, and reactive to  light.  Cardiovascular:     Rate and Rhythm: Normal rate and regular rhythm.     Pulses: Normal pulses.     Heart sounds: Normal heart sounds.  Pulmonary:     Effort: Pulmonary effort is normal.     Breath sounds: Normal breath sounds.  Abdominal:     General: Abdomen is flat. Bowel sounds are normal.     Palpations: Abdomen is soft.  Musculoskeletal:        General: Normal range of motion.     Cervical back: Normal range of motion.  Skin:    General: Skin is warm and dry.  Neurological:     General: No focal deficit present.     Mental Status: She is alert and oriented to person, place, and time.  Psychiatric:        Mood and Affect: Mood normal.        Behavior: Behavior normal.        Thought Content: Thought content normal.        Judgment: Judgment normal.      No results found for any visits on 12/20/22.  No results found for this or any previous visit (from the past 2160 hour(s)).    Assessment & Plan:  Xray left shoulder Mobic for pain  Problem List Items Addressed This Visit       Other   Acute pain of left shoulder - Primary   Relevant Orders   DG Shoulder Right    Return in about 4 weeks (around 01/17/2023) for with NK.   Total time spent: 25 minutes  [employer], NP  12/20/2022   This document may have been prepared by Dragon Voice Recognition software and as such may include unintentional dictation errors.

## 2023-01-16 ENCOUNTER — Other Ambulatory Visit: Payer: Self-pay | Admitting: Cardiology

## 2023-01-18 ENCOUNTER — Telehealth: Payer: Self-pay

## 2023-01-18 NOTE — Telephone Encounter (Signed)
Pt lvm requesting a call back- mail box full, unable to lvm-HQ

## 2023-01-22 ENCOUNTER — Other Ambulatory Visit: Payer: Self-pay | Admitting: Internal Medicine

## 2023-01-22 ENCOUNTER — Encounter: Payer: Self-pay | Admitting: Internal Medicine

## 2023-01-22 ENCOUNTER — Ambulatory Visit (INDEPENDENT_AMBULATORY_CARE_PROVIDER_SITE_OTHER): Payer: Medicare HMO | Admitting: Internal Medicine

## 2023-01-22 VITALS — BP 132/78 | HR 69 | Ht 63.0 in | Wt 244.2 lb

## 2023-01-22 DIAGNOSIS — R7302 Impaired glucose tolerance (oral): Secondary | ICD-10-CM

## 2023-01-22 DIAGNOSIS — I152 Hypertension secondary to endocrine disorders: Secondary | ICD-10-CM | POA: Diagnosis not present

## 2023-01-22 DIAGNOSIS — E1169 Type 2 diabetes mellitus with other specified complication: Secondary | ICD-10-CM | POA: Insufficient documentation

## 2023-01-22 DIAGNOSIS — E1165 Type 2 diabetes mellitus with hyperglycemia: Secondary | ICD-10-CM | POA: Diagnosis not present

## 2023-01-22 DIAGNOSIS — E782 Mixed hyperlipidemia: Secondary | ICD-10-CM | POA: Diagnosis not present

## 2023-01-22 DIAGNOSIS — Z0001 Encounter for general adult medical examination with abnormal findings: Secondary | ICD-10-CM | POA: Diagnosis not present

## 2023-01-22 DIAGNOSIS — R5383 Other fatigue: Secondary | ICD-10-CM

## 2023-01-22 DIAGNOSIS — Z1211 Encounter for screening for malignant neoplasm of colon: Secondary | ICD-10-CM

## 2023-01-22 DIAGNOSIS — E559 Vitamin D deficiency, unspecified: Secondary | ICD-10-CM

## 2023-01-22 DIAGNOSIS — Z0000 Encounter for general adult medical examination without abnormal findings: Secondary | ICD-10-CM

## 2023-01-22 LAB — POC CREATINE & ALBUMIN,URINE
Creatinine, POC: 300 mg/dL
Microalbumin Ur, POC: 80 mg/L

## 2023-01-22 MED ORDER — WEGOVY 0.25 MG/0.5ML ~~LOC~~ SOAJ: 0.2500 mg | SUBCUTANEOUS | 0 refills | Status: DC

## 2023-01-22 NOTE — Progress Notes (Signed)
Established Patient Office Visit  Subjective:  Patient ID: Karen Chan, female    DOB: 30-Mar-1950  Age: 72 y.o. MRN: [id number]  Chief Complaint  Patient presents with   Follow-up    1 mo follow up    Patient comes in for her follow-up and annual wellness visit, accompanied by her daughter.  Patient has a history of CVA but is able to ambulate with the help of a cane.  She is generally feeling well but mentions that sometimes she gets shortness of breath.  No chest pain and no palpitations. Her mammogram is up-to-date. BMD- 2023. Still needs her Cologuard, order sent again. Her PHQ-9/GAD score is 1/0. However her CFS score is very high at 18-patient does not complains of being forgetful, neither does the family.  Patient is quite comfortable and gets things done which are necessary but does not pay enough attention to other things.  They think that is why she scored abnormal on her CFS.  Today she agrees to start mental exercises and try to be more focused and attentive. She is due for blood work also. Will check microalbumin today. Sending in a new prescription for [facility], as it was not picked up last time.    No other concerns at this time.   Past Medical History:  Diagnosis Date   Acute ischemic left posterior cerebral artery (PCA) stroke (HCC)    Essential hypertension, benign    Impaired glucose tolerance in obese    Mixed hyperlipidemia    Non-seasonal allergic rhinitis    Stroke (HCC)    Vitamin D deficiency     Past Surgical History:  Procedure Laterality Date   lateral malleolar fracture Right     Social History   Socioeconomic History   Marital status: Married    Spouse name: Not on file   Number of children: Not on file   Years of education: Not on file   Highest education level: Not on file  Occupational History   Not on file  Tobacco Use   Smoking status: Never   Smokeless tobacco: Never  Vaping Use   Vaping status: Never Used  Substance  and Sexual Activity   Alcohol use: Never   Drug use: Never   Sexual activity: Not Currently  Other Topics Concern   Not on file  Social History Narrative   Not on file   Social Drivers of Health   Financial Resource Strain: Not on file  Food Insecurity: No Food Insecurity (09/15/2021)   Hunger Vital Sign    Worried About Running Out of Food in the Last Year: Never true    Ran Out of Food in the Last Year: Never true  Transportation Needs: No Transportation Needs (09/15/2021)   PRAPARE - Administrator, Civil Service (Medical): No    Lack of Transportation (Non-Medical): No  Physical Activity: Not on file  Stress: No Stress Concern Present (01/22/2023)   [employer] of Occupational Health - Occupational Stress Questionnaire    Feeling of Stress : Not at all  Social Connections: Not on file  Intimate Partner Violence: Not At Risk (01/22/2023)   Humiliation, Afraid, Rape, and Kick questionnaire    Fear of Current or Ex-Partner: No    Emotionally Abused: No    Physically Abused: No    Sexually Abused: No    Family History  Problem Relation Age of Onset   Hypertension Mother    Coronary artery disease Father    Breast  cancer Neg Hx     Allergies  Allergen Reactions   Penicillins Rash    Outpatient Medications Prior to Visit  Medication Sig   acetaminophen (TYLENOL) 325 MG tablet Take 2 tablets (650 mg total) by mouth every 4 (four) hours as needed for mild pain (or temp > 37.5 C (99.5 F)).   amLODipine (NORVASC) 5 MG tablet TAKE 1 TABLET BY MOUTH EVERY DAY   ASPIRIN LOW DOSE 81 MG tablet TAKE 1 TABLET BY MOUTH EVERY DAY   atorvastatin (LIPITOR) 40 MG tablet TAKE 1 TABLET BY MOUTH EVERY DAY   carvedilol (COREG) 25 MG tablet TAKE 1 TABLET BY MOUTH TWICE A DAY   citalopram (CELEXA) 20 MG tablet TAKE 1 TABLET BY MOUTH EVERY DAY   hydrochlorothiazide (HYDRODIURIL) 25 MG tablet TAKE 1 TABLET BY MOUTH EVERY DAY   irbesartan (AVAPRO) 300 MG tablet TAKE 1 TABLET  BY MOUTH EVERY DAY   meloxicam (MOBIC) 7.5 MG tablet Take 1 tablet (7.5 mg total) by mouth daily.   Vitamin D, Ergocalciferol, (DRISDOL) 1.25 MG (50000 UNIT) CAPS capsule Take 1 capsule (50,000 Units total) by mouth once a week.   [DISCONTINUED] Semaglutide-Weight Management (WEGOVY) 0.25 MG/0.5ML SOAJ INJECT 0.25MG  INTO THE SKIN ONE TIME PER WEEK (Patient not taking: Reported on 01/22/2023)   No facility-administered medications prior to visit.    Review of Systems  Constitutional: Negative.  Negative for chills, fever, malaise/fatigue and weight loss.  HENT: Negative.  Negative for hearing loss, sore throat and tinnitus.   Eyes: Negative.   Respiratory: Negative.  Negative for cough, shortness of breath and stridor.   Cardiovascular: Negative.  Negative for chest pain, palpitations and leg swelling.  Gastrointestinal: Negative.  Negative for abdominal pain, constipation, diarrhea, heartburn, nausea and vomiting.  Genitourinary: Negative.  Negative for dysuria and flank pain.  Musculoskeletal: Negative.  Negative for joint pain and myalgias.  Skin: Negative.   Neurological:  Positive for weakness. Negative for dizziness, tremors and headaches.  Endo/Heme/Allergies: Negative.   Psychiatric/Behavioral: Negative.  Negative for depression and suicidal ideas. The patient is not nervous/anxious.        Objective:   BP 132/78   Pulse 69   Ht 5\' 3"  (1.6 m)   Wt 244 lb 3.2 oz (110.8 kg)   SpO2 95%   BMI 43.26 kg/m   Vitals:   01/22/23 0945  BP: 132/78  Pulse: 69  Height: 5\' 3"  (1.6 m)  Weight: 244 lb 3.2 oz (110.8 kg)  SpO2: 95%  BMI (Calculated): 43.27    Physical Exam Vitals and nursing note reviewed.  Constitutional:      Appearance: Normal appearance.  HENT:     Head: Normocephalic and atraumatic.     Nose: Nose normal.     Mouth/Throat:     Mouth: Mucous membranes are moist.     Pharynx: Oropharynx is clear.  Eyes:     Conjunctiva/sclera: Conjunctivae normal.      Pupils: Pupils are equal, round, and reactive to light.  Cardiovascular:     Rate and Rhythm: Normal rate and regular rhythm.     Pulses: Normal pulses.     Heart sounds: Normal heart sounds. No murmur heard. Pulmonary:     Effort: Pulmonary effort is normal.     Breath sounds: Normal breath sounds. No wheezing.  Abdominal:     General: Bowel sounds are normal.     Palpations: Abdomen is soft.     Tenderness: There is no abdominal tenderness. There is  no right CVA tenderness or left CVA tenderness.  Musculoskeletal:        General: Normal range of motion.     Cervical back: Normal range of motion.     Right lower leg: No edema.     Left lower leg: No edema.  Skin:    General: Skin is warm and dry.  Neurological:     General: No focal deficit present.     Mental Status: She is alert and oriented to person, place, and time.  Psychiatric:        Mood and Affect: Mood normal.        Behavior: Behavior normal.      Results for orders placed or performed in visit on 01/22/23  POC CREATINE & ALBUMIN,URINE  Result Value Ref Range   Microalbumin Ur, POC 80 mg/L   Creatinine, POC 300 mg/dL   Albumin/Creatinine Ratio, Urine, POC 30-300     Recent Results (from the past 2160 hours)  POC CREATINE & ALBUMIN,URINE     Status: Abnormal   Collection Time: 01/22/23 10:24 AM  Result Value Ref Range   Microalbumin Ur, POC 80 mg/L   Creatinine, POC 300 mg/dL   Albumin/Creatinine Ratio, Urine, POC 30-300       Assessment & Plan:  Patient advised to continue all her medications.  Trial of Wegovy to help her lose weight. Problem List Items Addressed This Visit     Other fatigue   Relevant Orders   CBC with Diff   Obesity, Class III, BMI 40-49.9 (morbid obesity) (HCC)   Relevant Medications   Semaglutide-Weight Management (WEGOVY) 0.25 MG/0.5ML SOAJ   Vitamin D deficiency   Relevant Orders   Vitamin D (25 hydroxy)   Impaired glucose tolerance in obese   Relevant Medications    Semaglutide-Weight Management (WEGOVY) 0.25 MG/0.5ML SOAJ   Hypertension associated with diabetes (HCC)   Relevant Orders   CMP14+EGFR   Type 2 diabetes mellitus with hyperglycemia, without long-term current use of insulin (HCC)   Relevant Orders   Hemoglobin A1c   POC CREATINE & ALBUMIN,URINE (Completed)   Combined hyperlipidemia associated with type 2 diabetes mellitus (HCC)   Relevant Orders   Lipid Panel w/o Chol/HDL Ratio   Other Visit Diagnoses       Medicare annual wellness visit, subsequent    -  Primary     Colon cancer screening       Relevant Orders   Cologuard       Return in about 3 months (around 04/22/2023).   Total time spent: 30 minutes  Margaretann Loveless, MD  01/22/2023   This document may have been prepared by [facility] Voice Recognition software and as such may include unintentional dictation errors.

## 2023-01-23 ENCOUNTER — Telehealth: Payer: Self-pay | Admitting: Internal Medicine

## 2023-01-23 NOTE — Telephone Encounter (Signed)
Pt husband called to check on the status of her WEGOVY 0.25mg 

## 2023-01-25 ENCOUNTER — Other Ambulatory Visit: Payer: Self-pay

## 2023-01-25 MED ORDER — MELOXICAM 7.5 MG PO TABS: 7.5000 mg | ORAL_TABLET | Freq: Every day | ORAL | 0 refills | Status: DC

## 2023-01-26 ENCOUNTER — Other Ambulatory Visit: Payer: Self-pay | Admitting: Internal Medicine

## 2023-01-26 ENCOUNTER — Telehealth: Payer: Self-pay | Admitting: Internal Medicine

## 2023-01-26 DIAGNOSIS — R7302 Impaired glucose tolerance (oral): Secondary | ICD-10-CM

## 2023-01-26 MED ORDER — WEGOVY 0.25 MG/0.5ML ~~LOC~~ SOAJ: 0.2500 mg | SUBCUTANEOUS | 0 refills | Status: DC

## 2023-01-26 MED ORDER — MELOXICAM 7.5 MG PO TABS: 7.5000 mg | ORAL_TABLET | Freq: Every day | ORAL | 0 refills | Status: DC

## 2023-01-26 NOTE — Telephone Encounter (Signed)
Pt husband calling in to get his wife's WEGOVY & MELOXICAM sent to [facility] [address]

## 2023-03-24 ENCOUNTER — Other Ambulatory Visit: Payer: Self-pay | Admitting: Internal Medicine

## 2023-04-23 ENCOUNTER — Ambulatory Visit (INDEPENDENT_AMBULATORY_CARE_PROVIDER_SITE_OTHER): Payer: Medicare HMO | Admitting: Internal Medicine

## 2023-04-23 ENCOUNTER — Other Ambulatory Visit: Payer: Self-pay | Admitting: Internal Medicine

## 2023-04-23 ENCOUNTER — Encounter: Payer: Self-pay | Admitting: Internal Medicine

## 2023-04-23 VITALS — BP 134/84 | HR 83 | Ht 63.0 in | Wt 247.0 lb

## 2023-04-23 DIAGNOSIS — E1165 Type 2 diabetes mellitus with hyperglycemia: Secondary | ICD-10-CM | POA: Diagnosis not present

## 2023-04-23 DIAGNOSIS — I152 Hypertension secondary to endocrine disorders: Secondary | ICD-10-CM | POA: Diagnosis not present

## 2023-04-23 DIAGNOSIS — E559 Vitamin D deficiency, unspecified: Secondary | ICD-10-CM | POA: Diagnosis not present

## 2023-04-23 DIAGNOSIS — I63532 Cerebral infarction due to unspecified occlusion or stenosis of left posterior cerebral artery: Secondary | ICD-10-CM | POA: Diagnosis not present

## 2023-04-23 DIAGNOSIS — E1159 Type 2 diabetes mellitus with other circulatory complications: Secondary | ICD-10-CM | POA: Diagnosis not present

## 2023-04-23 DIAGNOSIS — E1169 Type 2 diabetes mellitus with other specified complication: Secondary | ICD-10-CM

## 2023-04-23 DIAGNOSIS — E66813 Obesity, class 3: Secondary | ICD-10-CM

## 2023-04-23 DIAGNOSIS — R5383 Other fatigue: Secondary | ICD-10-CM | POA: Diagnosis not present

## 2023-04-23 DIAGNOSIS — E782 Mixed hyperlipidemia: Secondary | ICD-10-CM | POA: Diagnosis not present

## 2023-04-23 NOTE — Progress Notes (Signed)
 Established Patient Office Visit  Subjective:  Patient ID: Karen Chan, female    DOB: 1950/03/30  Age: 73 y.o. MRN: [id number]  Chief Complaint  Patient presents with   Follow-up    3 month follow up    Patient comes in for follow-up accompanied by her husband.  She is generally feeling well and has no new complaints.  Her blood pressure seems to be under better control.  However she did not get her labs done in December, today she is fasting and will get her blood drawn.    No other concerns at this time.   Past Medical History:  Diagnosis Date   Acute ischemic left posterior cerebral artery (PCA) stroke (HCC)    Essential hypertension, benign    Impaired glucose tolerance in obese    Mixed hyperlipidemia    Non-seasonal allergic rhinitis    Stroke (HCC)    Vitamin D deficiency     Past Surgical History:  Procedure Laterality Date   lateral malleolar fracture Right     Social History   Socioeconomic History   Marital status: Married    Spouse name: Not on file   Number of children: Not on file   Years of education: Not on file   Highest education level: Not on file  Occupational History   Not on file  Tobacco Use   Smoking status: Never   Smokeless tobacco: Never  Vaping Use   Vaping status: Never Used  Substance and Sexual Activity   Alcohol use: Never   Drug use: Never   Sexual activity: Not Currently  Other Topics Concern   Not on file  Social History Narrative   Not on file   Social Drivers of Health   Financial Resource Strain: Not on file  Food Insecurity: No Food Insecurity (09/15/2021)   Hunger Vital Sign    Worried About Running Out of Food in the Last Year: Never true    Ran Out of Food in the Last Year: Never true  Transportation Needs: No Transportation Needs (09/15/2021)   PRAPARE - Administrator, Civil Service (Medical): No    Lack of Transportation (Non-Medical): No  Physical Activity: Not on file  Stress: No  Stress Concern Present (01/22/2023)   [employer] of Occupational Health - Occupational Stress Questionnaire    Feeling of Stress : Not at all  Social Connections: Not on file  Intimate Partner Violence: Not At Risk (01/22/2023)   Humiliation, Afraid, Rape, and Kick questionnaire    Fear of Current or Ex-Partner: No    Emotionally Abused: No    Physically Abused: No    Sexually Abused: No    Family History  Problem Relation Age of Onset   Hypertension Mother    Coronary artery disease Father    Breast cancer Neg Hx     Allergies  Allergen Reactions   Penicillins Rash    Outpatient Medications Prior to Visit  Medication Sig   acetaminophen (TYLENOL) 325 MG tablet Take 2 tablets (650 mg total) by mouth every 4 (four) hours as needed for mild pain (or temp > 37.5 C (99.5 F)).   amLODipine (NORVASC) 5 MG tablet TAKE 1 TABLET BY MOUTH EVERY DAY   ASPIRIN LOW DOSE 81 MG tablet TAKE 1 TABLET BY MOUTH EVERY DAY   atorvastatin (LIPITOR) 40 MG tablet TAKE 1 TABLET BY MOUTH EVERY DAY   carvedilol (COREG) 25 MG tablet TAKE 1 TABLET BY MOUTH TWICE A DAY  citalopram (CELEXA) 20 MG tablet TAKE 1 TABLET BY MOUTH EVERY DAY   hydrochlorothiazide (HYDRODIURIL) 25 MG tablet TAKE 1 TABLET BY MOUTH EVERY DAY   irbesartan (AVAPRO) 300 MG tablet TAKE 1 TABLET BY MOUTH EVERY DAY   meloxicam (MOBIC) 7.5 MG tablet TAKE 1 TABLET BY MOUTH EVERY DAY   Vitamin D, Ergocalciferol, (DRISDOL) 1.25 MG (50000 UNIT) CAPS capsule Take 1 capsule (50,000 Units total) by mouth once a week.   Semaglutide-Weight Management (WEGOVY) 0.25 MG/0.5ML SOAJ Inject 0.25 mg into the skin once a week. (Patient not taking: Reported on 04/23/2023)   No facility-administered medications prior to visit.    Review of Systems  Constitutional: Negative.  Negative for chills, fever, malaise/fatigue and weight loss.  HENT: Negative.  Negative for congestion, sinus pain and sore throat.   Eyes: Negative.   Respiratory: Negative.   Negative for cough and shortness of breath.   Cardiovascular: Negative.  Negative for chest pain, palpitations and leg swelling.  Gastrointestinal: Negative.  Negative for abdominal pain, constipation, diarrhea, heartburn, nausea and vomiting.  Genitourinary: Negative.  Negative for dysuria and flank pain.  Musculoskeletal: Negative.  Negative for joint pain and myalgias.  Skin: Negative.   Neurological: Negative.  Negative for dizziness and headaches.  Endo/Heme/Allergies: Negative.   Psychiatric/Behavioral: Negative.  Negative for depression and suicidal ideas. The patient is not nervous/anxious.        Objective:   BP 134/84   Pulse 83   Ht 5\' 3"  (1.6 m)   Wt 247 lb (112 kg)   SpO2 96%   BMI 43.75 kg/m   Vitals:   04/23/23 1021  BP: 134/84  Pulse: 83  Height: 5\' 3"  (1.6 m)  Weight: 247 lb (112 kg)  SpO2: 96%  BMI (Calculated): 43.76    Physical Exam Vitals and nursing note reviewed.  Constitutional:      Appearance: Normal appearance.  HENT:     Head: Normocephalic and atraumatic.     Nose: Nose normal.     Mouth/Throat:     Mouth: Mucous membranes are moist.     Pharynx: Oropharynx is clear.  Eyes:     Conjunctiva/sclera: Conjunctivae normal.     Pupils: Pupils are equal, round, and reactive to light.  Cardiovascular:     Rate and Rhythm: Normal rate and regular rhythm.     Pulses: Normal pulses.     Heart sounds: Normal heart sounds. No murmur heard. Pulmonary:     Effort: Pulmonary effort is normal.     Breath sounds: Normal breath sounds. No wheezing.  Abdominal:     General: Bowel sounds are normal.     Palpations: Abdomen is soft.     Tenderness: There is no abdominal tenderness. There is no right CVA tenderness or left CVA tenderness.  Musculoskeletal:        General: Normal range of motion.     Cervical back: Normal range of motion.     Right lower leg: No edema.     Left lower leg: No edema.  Skin:    General: Skin is warm and dry.   Neurological:     General: No focal deficit present.     Mental Status: She is alert and oriented to person, place, and time.  Psychiatric:        Mood and Affect: Mood normal.        Behavior: Behavior normal.      No results found for any visits on 04/23/23.  No results  found for this or any previous visit (from the past 2160 hours).    Assessment & Plan:  To continue current medications.  Check labs today.  Monitor blood pressure. Problem List Items Addressed This Visit     Acute ischemic left posterior cerebral artery (PCA) stroke (HCC)   Obesity, Class III, BMI 40-49.9 (morbid obesity) (HCC)   Hypertension associated with diabetes (HCC) - Primary   Type 2 diabetes mellitus with hyperglycemia, without long-term current use of insulin (HCC)   Combined hyperlipidemia associated with type 2 diabetes mellitus (HCC)    Return in about 3 months (around 07/24/2023).   Total time spent: 30 minutes  Margaretann Loveless, MD  04/23/2023   This document may have been prepared by [facility] Voice Recognition software and as such may include unintentional dictation errors.

## 2023-04-24 LAB — CMP14+EGFR
ALT: 9 IU/L (ref 0–32)
AST: 14 IU/L (ref 0–40)
Albumin: 4.4 g/dL (ref 3.9–4.9)
Alkaline Phosphatase: 85 IU/L (ref 44–121)
BUN/Creatinine Ratio: 18 (ref 12–28)
BUN: 21 mg/dL (ref 8–27)
Bilirubin Total: 0.5 mg/dL (ref 0.0–1.2)
CO2: 24 mmol/L (ref 20–29)
Calcium: 9.1 mg/dL (ref 8.7–10.3)
Chloride: 101 mmol/L (ref 96–106)
Creatinine, Ser: 1.2 mg/dL — ABNORMAL HIGH (ref 0.57–1.00)
Globulin, Total: 2.8 g/dL (ref 1.5–4.5)
Glucose: 117 mg/dL — ABNORMAL HIGH (ref 70–99)
Potassium: 4.1 mmol/L (ref 3.5–5.2)
Sodium: 139 mmol/L (ref 134–144)
Total Protein: 7.2 g/dL (ref 6.0–8.5)
eGFR: 49 mL/min/{1.73_m2} — ABNORMAL LOW (ref >59)

## 2023-04-24 LAB — CBC WITH DIFFERENTIAL/PLATELET
Basophils Absolute: 0.1 10*3/uL (ref 0.0–0.2)
Basos: 1 %
EOS (ABSOLUTE): 0.1 10*3/uL (ref 0.0–0.4)
Eos: 2 %
Hematocrit: 35.9 % (ref 34.0–46.6)
Hemoglobin: 12 g/dL (ref 11.1–15.9)
Immature Grans (Abs): 0 10*3/uL (ref 0.0–0.1)
Immature Granulocytes: 0 %
Lymphocytes Absolute: 1.5 10*3/uL (ref 0.7–3.1)
Lymphs: 25 %
MCH: 32.1 pg (ref 26.6–33.0)
MCHC: 33.4 g/dL (ref 31.5–35.7)
MCV: 96 fL (ref 79–97)
Monocytes Absolute: 0.5 10*3/uL (ref 0.1–0.9)
Monocytes: 9 %
Neutrophils Absolute: 3.7 10*3/uL (ref 1.4–7.0)
Neutrophils: 63 %
Platelets: 204 10*3/uL (ref 150–450)
RBC: 3.74 x10E6/uL — ABNORMAL LOW (ref 3.77–5.28)
RDW: 12.6 % (ref 11.7–15.4)
WBC: 6 10*3/uL (ref 3.4–10.8)

## 2023-04-24 LAB — LIPID PANEL W/O CHOL/HDL RATIO
Cholesterol, Total: 177 mg/dL (ref 100–199)
HDL: 89 mg/dL (ref >39)
LDL Chol Calc (NIH): 77 mg/dL (ref 0–99)
Triglycerides: 56 mg/dL (ref 0–149)
VLDL Cholesterol Cal: 11 mg/dL (ref 5–40)

## 2023-04-24 LAB — HEMOGLOBIN A1C
Est. average glucose Bld gHb Est-mCnc: 131 mg/dL
Hgb A1c MFr Bld: 6.2 % — ABNORMAL HIGH (ref 4.8–5.6)

## 2023-04-24 LAB — VITAMIN D 25 HYDROXY (VIT D DEFICIENCY, FRACTURES): Vit D, 25-Hydroxy: 26.4 ng/mL — ABNORMAL LOW (ref 30.0–100.0)

## 2023-04-24 NOTE — Progress Notes (Signed)
 Patient notified

## 2023-05-02 ENCOUNTER — Other Ambulatory Visit: Payer: Self-pay | Admitting: Internal Medicine

## 2023-05-02 DIAGNOSIS — I10 Essential (primary) hypertension: Secondary | ICD-10-CM

## 2023-06-01 DIAGNOSIS — Z135 Encounter for screening for eye and ear disorders: Secondary | ICD-10-CM | POA: Diagnosis not present

## 2023-06-01 DIAGNOSIS — H524 Presbyopia: Secondary | ICD-10-CM | POA: Diagnosis not present

## 2023-06-01 DIAGNOSIS — H5203 Hypermetropia, bilateral: Secondary | ICD-10-CM | POA: Diagnosis not present

## 2023-07-06 ENCOUNTER — Telehealth: Payer: Self-pay

## 2023-07-06 NOTE — Progress Notes (Signed)
   07/06/2023  Patient ID: Karen Chan, female   DOB: 06/29/1950, 73 y.o.   MRN: [id number]  Pharmacy Quality Measure Review  This patient is appearing on a report for being at risk of failing the adherence measure for cholesterol (statin) and hypertension (ACEi/ARB) medications this calendar year.   Medication: Irbesartan  and Atorvastatin  Last fill date: 03/23/23 for 55 day supply  Contacted pharmacy to facilitate refills. Patient will pick up today for 90DS.  Carnell Christian, PharmD Clinical Pharmacist [phone]

## 2023-07-24 ENCOUNTER — Encounter: Payer: Self-pay | Admitting: Internal Medicine

## 2023-07-24 ENCOUNTER — Ambulatory Visit (INDEPENDENT_AMBULATORY_CARE_PROVIDER_SITE_OTHER): Admitting: Internal Medicine

## 2023-07-24 ENCOUNTER — Other Ambulatory Visit: Payer: Self-pay | Admitting: Internal Medicine

## 2023-07-24 VITALS — BP 132/80 | HR 77 | Ht 63.0 in | Wt 250.0 lb

## 2023-07-24 DIAGNOSIS — Z1231 Encounter for screening mammogram for malignant neoplasm of breast: Secondary | ICD-10-CM

## 2023-07-24 DIAGNOSIS — E782 Mixed hyperlipidemia: Secondary | ICD-10-CM

## 2023-07-24 DIAGNOSIS — E559 Vitamin D deficiency, unspecified: Secondary | ICD-10-CM | POA: Diagnosis not present

## 2023-07-24 DIAGNOSIS — E1159 Type 2 diabetes mellitus with other circulatory complications: Secondary | ICD-10-CM | POA: Diagnosis not present

## 2023-07-24 DIAGNOSIS — E1165 Type 2 diabetes mellitus with hyperglycemia: Secondary | ICD-10-CM

## 2023-07-24 DIAGNOSIS — E66813 Obesity, class 3: Secondary | ICD-10-CM | POA: Diagnosis not present

## 2023-07-24 DIAGNOSIS — Z1211 Encounter for screening for malignant neoplasm of colon: Secondary | ICD-10-CM | POA: Diagnosis not present

## 2023-07-24 DIAGNOSIS — Z8673 Personal history of transient ischemic attack (TIA), and cerebral infarction without residual deficits: Secondary | ICD-10-CM | POA: Insufficient documentation

## 2023-07-24 DIAGNOSIS — E1169 Type 2 diabetes mellitus with other specified complication: Secondary | ICD-10-CM | POA: Diagnosis not present

## 2023-07-24 DIAGNOSIS — I152 Hypertension secondary to endocrine disorders: Secondary | ICD-10-CM

## 2023-07-24 MED ORDER — ZEPBOUND 2.5 MG/0.5ML ~~LOC~~ SOAJ: 2.5000 mg | SUBCUTANEOUS | 0 refills | Status: DC

## 2023-07-24 NOTE — Progress Notes (Signed)
 Established Patient Office Visit  Subjective:  Patient ID: Karen Chan, female    DOB: 09-04-1950  Age: 73 y.o. MRN: [id number]  Chief Complaint  Patient presents with   Follow-up    3 month follow up    Patient comes in for follow-up today accompanied by her daughter.  She is generally feeling well and has no new complaints.  However she has gained even more weight.  Her insurance did not approve of Mounjaro , we will try different one.  She is also encouraged to improve her diet control and increase her physical activity. Due for mammogram, will send orders.  She also never returned her Cologuard box, will order another one.  Her last Pap smear was in 2018. Check labs today.    No other concerns at this time.   Past Medical History:  Diagnosis Date   Acute ischemic left posterior cerebral artery (PCA) stroke (HCC)    Essential hypertension, benign    Impaired glucose tolerance in obese    Mixed hyperlipidemia    Non-seasonal allergic rhinitis    Stroke (HCC)    Vitamin D  deficiency     Past Surgical History:  Procedure Laterality Date   lateral malleolar fracture Right     Social History   Socioeconomic History   Marital status: Married    Spouse name: Not on file   Number of children: Not on file   Years of education: Not on file   Highest education level: Not on file  Occupational History   Not on file  Tobacco Use   Smoking status: Never   Smokeless tobacco: Never  Vaping Use   Vaping status: Never Used  Substance and Sexual Activity   Alcohol use: Never   Drug use: Never   Sexual activity: Not Currently  Other Topics Concern   Not on file  Social History Narrative   Not on file   Social Drivers of Health   Financial Resource Strain: Not on file  Food Insecurity: No Food Insecurity (09/15/2021)   Hunger Vital Sign    Worried About Running Out of Food in the Last Year: Never true    Ran Out of Food in the Last Year: Never true   Transportation Needs: No Transportation Needs (09/15/2021)   PRAPARE - Administrator, Civil Service (Medical): No    Lack of Transportation (Non-Medical): No  Physical Activity: Not on file  Stress: No Stress Concern Present (01/22/2023)   [employer] of Occupational Health - Occupational Stress Questionnaire    Feeling of Stress : Not at all  Social Connections: Not on file  Intimate Partner Violence: Not At Risk (01/22/2023)   Humiliation, Afraid, Rape, and Kick questionnaire    Fear of Current or Ex-Partner: No    Emotionally Abused: No    Physically Abused: No    Sexually Abused: No    Family History  Problem Relation Age of Onset   Hypertension Mother    Coronary artery disease Father    Breast cancer Neg Hx     Allergies  Allergen Reactions   Penicillins Rash    Outpatient Medications Prior to Visit  Medication Sig   acetaminophen  (TYLENOL ) 325 MG tablet Take 2 tablets (650 mg total) by mouth every 4 (four) hours as needed for mild pain (or temp > 37.5 C (99.5 F)).   amLODipine  (NORVASC ) 5 MG tablet TAKE 1 TABLET BY MOUTH EVERY DAY   ASPIRIN  LOW DOSE 81 MG tablet  TAKE 1 TABLET BY MOUTH EVERY DAY   atorvastatin  (LIPITOR) 40 MG tablet TAKE 1 TABLET BY MOUTH EVERY DAY   carvedilol  (COREG ) 25 MG tablet TAKE 1 TABLET BY MOUTH TWICE A DAY   citalopram (CELEXA) 20 MG tablet TAKE 1 TABLET BY MOUTH EVERY DAY   fluticasone (FLONASE) 50 MCG/ACT nasal spray SPRAY 2 SPRAYS INTO EACH NOSTRIL EVERY DAY   hydrochlorothiazide  (HYDRODIURIL ) 25 MG tablet TAKE 1 TABLET BY MOUTH EVERY DAY   irbesartan  (AVAPRO ) 300 MG tablet TAKE 1 TABLET BY MOUTH EVERY DAY   meloxicam  (MOBIC ) 7.5 MG tablet TAKE 1 TABLET BY MOUTH EVERY DAY   Vitamin D , Ergocalciferol , (DRISDOL ) 1.25 MG (50000 UNIT) CAPS capsule Take 1 capsule (50,000 Units total) by mouth once a week.   [DISCONTINUED] Semaglutide -Weight Management (WEGOVY ) 0.25 MG/0.5ML SOAJ Inject 0.25 mg into the skin once a week.  (Patient not taking: Reported on 07/24/2023)   No facility-administered medications prior to visit.    Review of Systems  Constitutional: Negative.  Negative for chills, diaphoresis, fever and malaise/fatigue.  HENT: Negative.  Negative for sore throat.   Eyes: Negative.   Respiratory: Negative.  Negative for cough and shortness of breath.   Cardiovascular: Negative.  Negative for chest pain, palpitations and leg swelling.  Gastrointestinal: Negative.  Negative for abdominal pain, constipation, diarrhea, heartburn, nausea and vomiting.  Genitourinary: Negative.  Negative for dysuria and flank pain.  Musculoskeletal: Negative.  Negative for joint pain and myalgias.  Skin: Negative.   Neurological: Negative.  Negative for dizziness and headaches.  Endo/Heme/Allergies: Negative.   Psychiatric/Behavioral: Negative.  Negative for depression and suicidal ideas. The patient is not nervous/anxious.        Objective:   BP 132/80   Pulse 77   Ht 5' 3 (1.6 m)   Wt 250 lb (113.4 kg)   SpO2 94%   BMI 44.29 kg/m   Vitals:   07/24/23 1009  BP: 132/80  Pulse: 77  Height: 5' 3 (1.6 m)  Weight: 250 lb (113.4 kg)  SpO2: 94%  BMI (Calculated): 44.3    Physical Exam Vitals and nursing note reviewed.  Constitutional:      Appearance: Normal appearance.  HENT:     Head: Normocephalic and atraumatic.     Nose: Nose normal.     Mouth/Throat:     Mouth: Mucous membranes are moist.     Pharynx: Oropharynx is clear.   Eyes:     Conjunctiva/sclera: Conjunctivae normal.     Pupils: Pupils are equal, round, and reactive to light.    Cardiovascular:     Rate and Rhythm: Normal rate and regular rhythm.     Pulses: Normal pulses.     Heart sounds: Normal heart sounds. No murmur heard. Pulmonary:     Effort: Pulmonary effort is normal.     Breath sounds: Normal breath sounds. No wheezing.  Abdominal:     General: Bowel sounds are normal.     Palpations: Abdomen is soft.      Tenderness: There is no abdominal tenderness. There is no right CVA tenderness or left CVA tenderness.   Musculoskeletal:        General: Normal range of motion.     Cervical back: Normal range of motion.     Right lower leg: No edema.     Left lower leg: No edema.   Skin:    General: Skin is warm and dry.   Neurological:     General: No focal deficit present.  Mental Status: She is alert and oriented to person, place, and time.   Psychiatric:        Mood and Affect: Mood normal.        Behavior: Behavior normal.      No results found for any visits on 07/24/23.  No results found for this or any previous visit (from the past 2160 hours).    Assessment & Plan:  Continue current medications.  Check labs.  Monitor blood pressure.  Schedule mammogram and set up for Cologuard. Problem List Items Addressed This Visit     Obesity, Class III, BMI 40-49.9 (morbid obesity)   Vitamin D  deficiency   Relevant Orders   Vitamin D  (25 hydroxy)   Hypertension associated with diabetes (HCC) - Primary   Relevant Orders   CMP14+EGFR   Type 2 diabetes mellitus with hyperglycemia, without long-term current use of insulin (HCC)   Relevant Orders   Hemoglobin A1c   Combined hyperlipidemia associated with type 2 diabetes mellitus (HCC)   Relevant Orders   Lipid Panel w/o Chol/HDL Ratio   History of CVA (cerebrovascular accident)   Other Visit Diagnoses       Breast cancer screening by mammogram       Relevant Orders   MM 3D SCREENING MAMMOGRAM BILATERAL BREAST     Colon cancer screening       Relevant Orders   Cologuard       Follow up 3 months.  Total time spent: 30 minutes  Aisha Hove, MD  07/24/2023   This document may have been prepared by [facility] Voice Recognition software and as such may include unintentional dictation errors.

## 2023-08-06 ENCOUNTER — Other Ambulatory Visit: Payer: Self-pay | Admitting: Internal Medicine

## 2023-08-06 DIAGNOSIS — I10 Essential (primary) hypertension: Secondary | ICD-10-CM

## 2023-09-17 ENCOUNTER — Ambulatory Visit: Admitting: Internal Medicine

## 2023-09-20 ENCOUNTER — Encounter: Payer: Self-pay | Admitting: Internal Medicine

## 2023-09-20 ENCOUNTER — Ambulatory Visit (INDEPENDENT_AMBULATORY_CARE_PROVIDER_SITE_OTHER): Admitting: Internal Medicine

## 2023-09-20 ENCOUNTER — Ambulatory Visit: Payer: Self-pay | Admitting: Internal Medicine

## 2023-09-20 VITALS — BP 138/78 | HR 93 | Ht 63.0 in | Wt 255.6 lb

## 2023-09-20 DIAGNOSIS — I152 Hypertension secondary to endocrine disorders: Secondary | ICD-10-CM

## 2023-09-20 DIAGNOSIS — E1169 Type 2 diabetes mellitus with other specified complication: Secondary | ICD-10-CM | POA: Diagnosis not present

## 2023-09-20 DIAGNOSIS — E66813 Obesity, class 3: Secondary | ICD-10-CM | POA: Diagnosis not present

## 2023-09-20 DIAGNOSIS — E782 Mixed hyperlipidemia: Secondary | ICD-10-CM | POA: Diagnosis not present

## 2023-09-20 DIAGNOSIS — E559 Vitamin D deficiency, unspecified: Secondary | ICD-10-CM

## 2023-09-20 DIAGNOSIS — E1159 Type 2 diabetes mellitus with other circulatory complications: Secondary | ICD-10-CM | POA: Diagnosis not present

## 2023-09-20 DIAGNOSIS — E1165 Type 2 diabetes mellitus with hyperglycemia: Secondary | ICD-10-CM | POA: Diagnosis not present

## 2023-09-20 LAB — POCT CBG (FASTING - GLUCOSE)-MANUAL ENTRY: Glucose Fasting, POC: 144 mg/dL — AB (ref 70–99)

## 2023-09-20 MED ORDER — SEMAGLUTIDE(0.25 OR 0.5MG/DOS) 2 MG/3ML ~~LOC~~ SOPN: 0.2500 mg | PEN_INJECTOR | SUBCUTANEOUS | 0 refills | Status: DC

## 2023-09-20 NOTE — Progress Notes (Signed)
 Established Patient Office Visit  Subjective:  Patient ID: Karen Chan, female    DOB: 05-Aug-1950  Age: 73 y.o. MRN: [id number]  Chief Complaint  Patient presents with   Follow-up    3 month follow up    Patient comes in for her follow-up today accompanied by her husband.  She has still not done her mammogram yet, will call them for appointment.  Also did not complete her Cologuard test.  At her last visit her labs were not drawn so she will attempt again today.  Patient has gained more weight and is now willing to try Ozempic, will send in a prescription again.  Denies nausea or vomiting, no abdominal pain, no chest pain and no palpitations.    No other concerns at this time.   Past Medical History:  Diagnosis Date   Acute ischemic left posterior cerebral artery (PCA) stroke (HCC)    Essential hypertension, benign    Impaired glucose tolerance in obese    Mixed hyperlipidemia    Non-seasonal allergic rhinitis    Stroke (HCC)    Vitamin D deficiency     Past Surgical History:  Procedure Laterality Date   lateral malleolar fracture Right     Social History   Socioeconomic History   Marital status: Married    Spouse name: Not on file   Number of children: Not on file   Years of education: Not on file   Highest education level: Not on file  Occupational History   Not on file  Tobacco Use   Smoking status: Never   Smokeless tobacco: Never  Vaping Use   Vaping status: Never Used  Substance and Sexual Activity   Alcohol use: Never   Drug use: Never   Sexual activity: Not Currently  Other Topics Concern   Not on file  Social History Narrative   Not on file   Social Drivers of Health   Financial Resource Strain: Not on file  Food Insecurity: No Food Insecurity (09/15/2021)   Hunger Vital Sign    Worried About Running Out of Food in the Last Year: Never true    Ran Out of Food in the Last Year: Never true  Transportation Needs: No Transportation Needs  (09/15/2021)   PRAPARE - Administrator, Civil Service (Medical): No    Lack of Transportation (Non-Medical): No  Physical Activity: Not on file  Stress: No Stress Concern Present (01/22/2023)   [employer] of Occupational Health - Occupational Stress Questionnaire    Feeling of Stress : Not at all  Social Connections: Not on file  Intimate Partner Violence: Not At Risk (01/22/2023)   Humiliation, Afraid, Rape, and Kick questionnaire    Fear of Current or Ex-Partner: No    Emotionally Abused: No    Physically Abused: No    Sexually Abused: No    Family History  Problem Relation Age of Onset   Hypertension Mother    Coronary artery disease Father    Breast cancer Neg Hx     Allergies  Allergen Reactions   Penicillins Rash    Outpatient Medications Prior to Visit  Medication Sig   acetaminophen (TYLENOL) 325 MG tablet Take 2 tablets (650 mg total) by mouth every 4 (four) hours as needed for mild pain (or temp > 37.5 C (99.5 F)).   amLODipine (NORVASC) 5 MG tablet TAKE 1 TABLET BY MOUTH EVERY DAY   ASPIRIN LOW DOSE 81 MG tablet TAKE 1 TABLET BY MOUTH  EVERY DAY   atorvastatin (LIPITOR) 40 MG tablet TAKE 1 TABLET BY MOUTH EVERY DAY   carvedilol (COREG) 25 MG tablet TAKE 1 TABLET BY MOUTH TWICE A DAY   citalopram (CELEXA) 20 MG tablet TAKE 1 TABLET BY MOUTH EVERY DAY   fluticasone (FLONASE) 50 MCG/ACT nasal spray SPRAY 2 SPRAYS INTO EACH NOSTRIL EVERY DAY   hydrochlorothiazide (HYDRODIURIL) 25 MG tablet TAKE 1 TABLET BY MOUTH EVERY DAY   irbesartan (AVAPRO) 300 MG tablet TAKE 1 TABLET BY MOUTH EVERY DAY   meloxicam (MOBIC) 7.5 MG tablet TAKE 1 TABLET BY MOUTH EVERY DAY   Vitamin D, Ergocalciferol, (DRISDOL) 1.25 MG (50000 UNIT) CAPS capsule Take 1 capsule (50,000 Units total) by mouth once a week.   [DISCONTINUED] Semaglutide,0.25 or 0.5MG /DOS, 2 MG/3ML SOPN Inject 0.25 mg into the skin once a week. (Patient not taking: Reported on 09/20/2023)   No  facility-administered medications prior to visit.    Review of Systems  Constitutional: Negative.  Negative for chills, diaphoresis, fever and malaise/fatigue.  HENT: Negative.  Negative for sore throat.   Eyes: Negative.   Respiratory: Negative.  Negative for cough and shortness of breath.   Cardiovascular: Negative.  Negative for chest pain, palpitations and leg swelling.  Gastrointestinal: Negative.  Negative for abdominal pain, constipation, diarrhea, heartburn, nausea and vomiting.  Genitourinary: Negative.  Negative for dysuria and flank pain.  Musculoskeletal: Negative.  Negative for joint pain and myalgias.  Skin: Negative.   Neurological: Negative.  Negative for dizziness and headaches.  Endo/Heme/Allergies: Negative.   Psychiatric/Behavioral: Negative.  Negative for depression and suicidal ideas. The patient is not nervous/anxious.        Objective:   BP 138/78   Pulse 93   Ht 5' 3 (1.6 m)   Wt 255 lb 9.6 oz (115.9 kg)   SpO2 95%   BMI 45.28 kg/m   Vitals:   09/20/23 0940  BP: 138/78  Pulse: 93  Height: 5' 3 (1.6 m)  Weight: 255 lb 9.6 oz (115.9 kg)  SpO2: 95%  BMI (Calculated): 45.29    Physical Exam Vitals and nursing note reviewed.  Constitutional:      Appearance: Normal appearance.  HENT:     Head: Normocephalic and atraumatic.     Nose: Nose normal.     Mouth/Throat:     Mouth: Mucous membranes are moist.     Pharynx: Oropharynx is clear.  Eyes:     Conjunctiva/sclera: Conjunctivae normal.     Pupils: Pupils are equal, round, and reactive to light.  Cardiovascular:     Rate and Rhythm: Normal rate and regular rhythm.     Pulses: Normal pulses.     Heart sounds: Normal heart sounds. No murmur heard. Pulmonary:     Effort: Pulmonary effort is normal.     Breath sounds: Normal breath sounds. No wheezing.  Abdominal:     General: Bowel sounds are normal.     Palpations: Abdomen is soft.     Tenderness: There is no abdominal tenderness.  There is no right CVA tenderness or left CVA tenderness.  Musculoskeletal:        General: Normal range of motion.     Cervical back: Normal range of motion.     Right lower leg: No edema.     Left lower leg: No edema.  Lymphadenopathy:     Cervical: No cervical adenopathy.  Skin:    General: Skin is warm and dry.  Neurological:     General: No  focal deficit present.     Mental Status: She is alert and oriented to person, place, and time.  Psychiatric:        Mood and Affect: Mood normal.        Behavior: Behavior normal.      Results for orders placed or performed in visit on 09/20/23  POCT CBG (Fasting - Glucose)  Result Value Ref Range   Glucose Fasting, POC 144 (A) 70 - 99 mg/dL    Recent Results (from the past 2160 hours)  POCT CBG (Fasting - Glucose)     Status: Abnormal   Collection Time: 09/20/23  9:45 AM  Result Value Ref Range   Glucose Fasting, POC 144 (A) 70 - 99 mg/dL      Assessment & Plan:  Patient advised to continue her medications regularly.  Will attempt Ozempic injections.  Check blood work today.  Strict diet control emphasized. Problem List Items Addressed This Visit     Obesity, Class III, BMI 40-49.9 (morbid obesity)   Relevant Medications   Semaglutide,0.25 or 0.5MG /DOS, 2 MG/3ML SOPN   Vitamin D deficiency   Hypertension associated with diabetes (HCC) - Primary   Relevant Medications   Semaglutide,0.25 or 0.5MG /DOS, 2 MG/3ML SOPN   Type 2 diabetes mellitus with hyperglycemia, without long-term current use of insulin (HCC)   Relevant Medications   Semaglutide,0.25 or 0.5MG /DOS, 2 MG/3ML SOPN   Other Relevant Orders   POCT CBG (Fasting - Glucose) (Completed)   Combined hyperlipidemia associated with type 2 diabetes mellitus (HCC)   Relevant Medications   Semaglutide,0.25 or 0.5MG /DOS, 2 MG/3ML SOPN    Return in about 1 month (around 10/21/2023).   Total time spent: 30 minutes  FERNAND FREDY RAMAN, MD  09/20/2023   This document may have  been prepared by [facility] Voice Recognition software and as such may include unintentional dictation errors.

## 2023-09-21 ENCOUNTER — Ambulatory Visit: Payer: Self-pay | Admitting: Internal Medicine

## 2023-09-21 LAB — CMP14+EGFR
ALT: 17 IU/L (ref 0–32)
AST: 20 IU/L (ref 0–40)
Albumin: 4.3 g/dL (ref 3.9–4.9)
Alkaline Phosphatase: 91 IU/L (ref 44–121)
BUN/Creatinine Ratio: 15 (ref 12–28)
BUN: 15 mg/dL (ref 8–27)
Bilirubin Total: 0.7 mg/dL (ref 0.0–1.2)
CO2: 23 mmol/L (ref 20–29)
Calcium: 9.6 mg/dL (ref 8.7–10.3)
Chloride: 100 mmol/L (ref 96–106)
Creatinine, Ser: 1.03 mg/dL — ABNORMAL HIGH (ref 0.57–1.00)
Globulin, Total: 3.6 g/dL (ref 1.5–4.5)
Glucose: 120 mg/dL — ABNORMAL HIGH (ref 70–99)
Potassium: 4 mmol/L (ref 3.5–5.2)
Sodium: 140 mmol/L (ref 134–144)
Total Protein: 7.9 g/dL (ref 6.0–8.5)
eGFR: 58 mL/min/1.73 — ABNORMAL LOW (ref >59)

## 2023-09-21 LAB — LIPID PANEL W/O CHOL/HDL RATIO
Cholesterol, Total: 189 mg/dL (ref 100–199)
HDL: 98 mg/dL (ref >39)
LDL Chol Calc (NIH): 78 mg/dL (ref 0–99)
Triglycerides: 73 mg/dL (ref 0–149)
VLDL Cholesterol Cal: 13 mg/dL (ref 5–40)

## 2023-09-21 LAB — HEMOGLOBIN A1C
Est. average glucose Bld gHb Est-mCnc: 134 mg/dL
Hgb A1c MFr Bld: 6.3 % — ABNORMAL HIGH (ref 4.8–5.6)

## 2023-09-21 LAB — VITAMIN D 25 HYDROXY (VIT D DEFICIENCY, FRACTURES): Vit D, 25-Hydroxy: 37.9 ng/mL (ref 30.0–100.0)

## 2023-09-21 NOTE — Progress Notes (Signed)
 Patient notified

## 2023-10-22 ENCOUNTER — Ambulatory Visit (INDEPENDENT_AMBULATORY_CARE_PROVIDER_SITE_OTHER): Admitting: Internal Medicine

## 2023-10-22 ENCOUNTER — Encounter: Payer: Self-pay | Admitting: Internal Medicine

## 2023-10-22 VITALS — BP 136/86 | HR 87 | Ht 63.0 in | Wt 255.0 lb

## 2023-10-22 DIAGNOSIS — E66813 Obesity, class 3: Secondary | ICD-10-CM | POA: Diagnosis not present

## 2023-10-22 DIAGNOSIS — E1169 Type 2 diabetes mellitus with other specified complication: Secondary | ICD-10-CM | POA: Diagnosis not present

## 2023-10-22 DIAGNOSIS — E782 Mixed hyperlipidemia: Secondary | ICD-10-CM

## 2023-10-22 DIAGNOSIS — Z23 Encounter for immunization: Secondary | ICD-10-CM

## 2023-10-22 DIAGNOSIS — E1159 Type 2 diabetes mellitus with other circulatory complications: Secondary | ICD-10-CM

## 2023-10-22 DIAGNOSIS — I152 Hypertension secondary to endocrine disorders: Secondary | ICD-10-CM | POA: Diagnosis not present

## 2023-10-22 DIAGNOSIS — E1165 Type 2 diabetes mellitus with hyperglycemia: Secondary | ICD-10-CM

## 2023-10-22 LAB — POCT CBG (FASTING - GLUCOSE)-MANUAL ENTRY: Glucose Fasting, POC: 106 mg/dL — AB (ref 70–99)

## 2023-10-22 MED ORDER — COVID-19 MRNA VAC-TRIS(PFIZER) 30 MCG/0.3ML IM SUSY: 0.3000 mL | PREFILLED_SYRINGE | Freq: Once | INTRAMUSCULAR | 0 refills | Status: AC

## 2023-10-22 MED ORDER — SEMAGLUTIDE(0.25 OR 0.5MG/DOS) 2 MG/3ML ~~LOC~~ SOPN: 0.5000 mg | PEN_INJECTOR | SUBCUTANEOUS | 1 refills | Status: DC

## 2023-10-22 NOTE — Progress Notes (Signed)
 Established Patient Office Visit  Subjective:  Patient ID: Karen Chan, female    DOB: 1950/04/27  Age: 73 y.o. MRN: [id number]  Chief Complaint  Patient presents with   Follow-up    1 month follow up    Patient comes in with her daughter today for follow up. Reports of starting Ozempic  injections 0.25 mg/week. Tolerating it well. No c/o nausea or vomiting, no abdominal pain, no significant constipation. Will increase dose to 0.5 mg/week. Patient also advised to maintain a strict  diet control and physical activity. Still waiting to get her mammogram.    No other concerns at this time.   Past Medical History:  Diagnosis Date   Acute ischemic left posterior cerebral artery (PCA) stroke (HCC)    Essential hypertension, benign    Impaired glucose tolerance in obese    Mixed hyperlipidemia    Non-seasonal allergic rhinitis    Stroke (HCC)    Vitamin D  deficiency     Past Surgical History:  Procedure Laterality Date   lateral malleolar fracture Right     Social History   Socioeconomic History   Marital status: Married    Spouse name: Not on file   Number of children: Not on file   Years of education: Not on file   Highest education level: Not on file  Occupational History   Not on file  Tobacco Use   Smoking status: Never   Smokeless tobacco: Never  Vaping Use   Vaping status: Never Used  Substance and Sexual Activity   Alcohol use: Never   Drug use: Never   Sexual activity: Not Currently  Other Topics Concern   Not on file  Social History Narrative   Not on file   Social Drivers of Health   Financial Resource Strain: Not on file  Food Insecurity: No Food Insecurity (09/15/2021)   Hunger Vital Sign    Worried About Running Out of Food in the Last Year: Never true    Ran Out of Food in the Last Year: Never true  Transportation Needs: No Transportation Needs (09/15/2021)   PRAPARE - Administrator, Civil Service (Medical): No    Lack  of Transportation (Non-Medical): No  Physical Activity: Not on file  Stress: No Stress Concern Present (01/22/2023)   [employer] of Occupational Health - Occupational Stress Questionnaire    Feeling of Stress : Not at all  Social Connections: Not on file  Intimate Partner Violence: Not At Risk (01/22/2023)   Humiliation, Afraid, Rape, and Kick questionnaire    Fear of Current or Ex-Partner: No    Emotionally Abused: No    Physically Abused: No    Sexually Abused: No    Family History  Problem Relation Age of Onset   Hypertension Mother    Coronary artery disease Father    Breast cancer Neg Hx     Allergies  Allergen Reactions   Penicillins Rash    Outpatient Medications Prior to Visit  Medication Sig   acetaminophen  (TYLENOL ) 325 MG tablet Take 2 tablets (650 mg total) by mouth every 4 (four) hours as needed for mild pain (or temp > 37.5 C (99.5 F)).   amLODipine  (NORVASC ) 5 MG tablet TAKE 1 TABLET BY MOUTH EVERY DAY   ASPIRIN  LOW DOSE 81 MG tablet TAKE 1 TABLET BY MOUTH EVERY DAY   atorvastatin  (LIPITOR) 40 MG tablet TAKE 1 TABLET BY MOUTH EVERY DAY   carvedilol  (COREG ) 25 MG tablet TAKE 1 TABLET  BY MOUTH TWICE A DAY   citalopram (CELEXA) 20 MG tablet TAKE 1 TABLET BY MOUTH EVERY DAY   fluticasone (FLONASE) 50 MCG/ACT nasal spray SPRAY 2 SPRAYS INTO EACH NOSTRIL EVERY DAY   hydrochlorothiazide  (HYDRODIURIL ) 25 MG tablet TAKE 1 TABLET BY MOUTH EVERY DAY   irbesartan  (AVAPRO ) 300 MG tablet TAKE 1 TABLET BY MOUTH EVERY DAY   meloxicam  (MOBIC ) 7.5 MG tablet TAKE 1 TABLET BY MOUTH EVERY DAY   Vitamin D , Ergocalciferol , (DRISDOL ) 1.25 MG (50000 UNIT) CAPS capsule Take 1 capsule (50,000 Units total) by mouth once a week.   [DISCONTINUED] Semaglutide ,0.25 or 0.5MG /DOS, 2 MG/3ML SOPN Inject 0.25 mg into the skin once a week.   No facility-administered medications prior to visit.    Review of Systems  Constitutional: Negative.  Negative for chills, fever and  malaise/fatigue.  HENT: Negative.  Negative for congestion and sore throat.   Eyes: Negative.  Negative for blurred vision and pain.  Respiratory: Negative.  Negative for cough and shortness of breath.   Cardiovascular: Negative.  Negative for chest pain, palpitations and leg swelling.  Gastrointestinal: Negative.  Negative for abdominal pain, blood in stool, constipation, diarrhea, heartburn, melena, nausea and vomiting.  Genitourinary: Negative.  Negative for dysuria, flank pain, frequency and urgency.  Musculoskeletal: Negative.  Negative for joint pain and myalgias.  Skin: Negative.   Neurological: Negative.  Negative for dizziness, tingling, sensory change, weakness and headaches.  Endo/Heme/Allergies: Negative.   Psychiatric/Behavioral: Negative.  Negative for depression and suicidal ideas. The patient is not nervous/anxious.        Objective:   BP 136/86   Pulse 87   Ht 5' 3 (1.6 m)   Wt 255 lb (115.7 kg)   SpO2 95%   BMI 45.17 kg/m   Vitals:   10/22/23 1025  BP: 136/86  Pulse: 87  Height: 5' 3 (1.6 m)  Weight: 255 lb (115.7 kg)  SpO2: 95%  BMI (Calculated): 45.18    Physical Exam Vitals and nursing note reviewed.  Constitutional:      Appearance: Normal appearance.  HENT:     Head: Normocephalic and atraumatic.     Nose: Nose normal.     Mouth/Throat:     Mouth: Mucous membranes are moist.     Pharynx: Oropharynx is clear.  Eyes:     Conjunctiva/sclera: Conjunctivae normal.     Pupils: Pupils are equal, round, and reactive to light.  Cardiovascular:     Rate and Rhythm: Normal rate and regular rhythm.     Pulses: Normal pulses.     Heart sounds: Normal heart sounds. No murmur heard. Pulmonary:     Effort: Pulmonary effort is normal.     Breath sounds: Normal breath sounds. No wheezing.  Abdominal:     General: Bowel sounds are normal.     Palpations: Abdomen is soft.     Tenderness: There is no abdominal tenderness. There is no right CVA tenderness  or left CVA tenderness.  Musculoskeletal:        General: Normal range of motion.     Cervical back: Normal range of motion.     Right lower leg: No edema.     Left lower leg: No edema.  Skin:    General: Skin is warm and dry.  Neurological:     General: No focal deficit present.     Mental Status: She is alert and oriented to person, place, and time.  Psychiatric:        Mood  and Affect: Mood normal.        Behavior: Behavior normal.      Results for orders placed or performed in visit on 10/22/23  POCT CBG (Fasting - Glucose)  Result Value Ref Range   Glucose Fasting, POC 106 (A) 70 - 99 mg/dL    Recent Results (from the past 2160 hours)  POCT CBG (Fasting - Glucose)     Status: Abnormal   Collection Time: 09/20/23  9:45 AM  Result Value Ref Range   Glucose Fasting, POC 144 (A) 70 - 99 mg/dL  [id number]     Status: Abnormal   Collection Time: 09/20/23 10:08 AM  Result Value Ref Range   Glucose 120 (H) 70 - 99 mg/dL   BUN 15 8 - 27 mg/dL   Creatinine, Ser 8.96 (H) 0.57 - 1.00 mg/dL   eGFR 58 (L) >40 fO/fpw/8.26   BUN/Creatinine Ratio 15 12 - 28   Sodium 140 134 - 144 mmol/L   Potassium 4.0 3.5 - 5.2 mmol/L   Chloride 100 96 - 106 mmol/L   CO2 23 20 - 29 mmol/L   Calcium  9.6 8.7 - 10.3 mg/dL   Total Protein 7.9 6.0 - 8.5 g/dL   Albumin 4.3 3.9 - 4.9 g/dL   Globulin, Total 3.6 1.5 - 4.5 g/dL   Bilirubin Total 0.7 0.0 - 1.2 mg/dL   Alkaline Phosphatase 91 44 - 121 IU/L   AST 20 0 - 40 IU/L   ALT 17 0 - 32 IU/L  Lipid Panel w/o Chol/HDL Ratio     Status: None   Collection Time: 09/20/23 10:08 AM  Result Value Ref Range   Cholesterol, Total 189 100 - 199 mg/dL   Triglycerides 73 0 - 149 mg/dL   HDL 98 >60 mg/dL   VLDL Cholesterol Cal 13 5 - 40 mg/dL   LDL Chol Calc (NIH) 78 0 - 99 mg/dL  Vitamin D  (25 hydroxy)     Status: None   Collection Time: 09/20/23 10:08 AM  Result Value Ref Range   Vit D, 25-Hydroxy 37.9 30.0 - 100.0 ng/mL    Comment: Vitamin D   deficiency has been defined by the Institute of Medicine and an Endocrine Society practice guideline as a level of serum 25-OH vitamin D  less than 20 ng/mL (1,2). The Endocrine Society went on to further define vitamin D  insufficiency as a level between 21 and 29 ng/mL (2). 1. IOM (Institute of Medicine). 2010. Dietary reference    intakes for calcium  and D. [place]: The    [employer]. 2. Holick MF, Binkley [place], Bischoff-Ferrari HA, et al.    Evaluation, treatment, and prevention of vitamin D     deficiency: an Endocrine Society clinical practice    guideline. JCEM. 2011 Jul; 96(7):1911-30.   Hemoglobin A1c     Status: Abnormal   Collection Time: 09/20/23 10:08 AM  Result Value Ref Range   Hgb A1c MFr Bld 6.3 (H) 4.8 - 5.6 %    Comment:          Prediabetes: 5.7 - 6.4          Diabetes: >6.4          Glycemic control for adults with diabetes: <7.0    Est. average glucose Bld gHb Est-mCnc 134 mg/dL  POCT CBG (Fasting - Glucose)     Status: Abnormal   Collection Time: 10/22/23 10:31 AM  Result Value Ref Range   Glucose Fasting, POC 106 (A) 70 - 99 mg/dL  Assessment & Plan:  Increase Ozempic  to 0.5 mg/week. Continue current medication. Covid vaccine rx sent. Problem List Items Addressed This Visit     Obesity, Class III, BMI 40-49.9 (morbid obesity)   Relevant Medications   Semaglutide ,0.25 or 0.5MG /DOS, 2 MG/3ML SOPN   Hypertension associated with diabetes (HCC)   Relevant Medications   Semaglutide ,0.25 or 0.5MG /DOS, 2 MG/3ML SOPN   Type 2 diabetes mellitus with hyperglycemia, without long-term current use of insulin (HCC) - Primary   Relevant Medications   Semaglutide ,0.25 or 0.5MG /DOS, 2 MG/3ML SOPN   Other Relevant Orders   POCT CBG (Fasting - Glucose) (Completed)   Combined hyperlipidemia associated with type 2 diabetes mellitus (HCC)   Relevant Medications   Semaglutide ,0.25 or 0.5MG /DOS, 2 MG/3ML SOPN   Other Visit Diagnoses       Need for  vaccination       Relevant Medications   COVID-19 mRNA vaccine, Pfizer, (COMIRNATY) syringe       Return in about 1 month (around 11/21/2023).   Total time spent: 25 minutes  FERNAND FREDY RAMAN, MD  10/22/2023   This document may have been prepared by [facility] Voice Recognition software and as such may include unintentional dictation errors.

## 2023-11-22 ENCOUNTER — Ambulatory Visit: Admitting: Internal Medicine

## 2023-11-23 ENCOUNTER — Encounter: Payer: Self-pay | Admitting: Internal Medicine

## 2023-11-23 ENCOUNTER — Ambulatory Visit: Admitting: Internal Medicine

## 2023-11-23 ENCOUNTER — Ambulatory Visit: Payer: Self-pay | Admitting: Internal Medicine

## 2023-11-23 VITALS — BP 132/78 | HR 85 | Ht 63.0 in | Wt 249.0 lb

## 2023-11-23 DIAGNOSIS — I152 Hypertension secondary to endocrine disorders: Secondary | ICD-10-CM | POA: Diagnosis not present

## 2023-11-23 DIAGNOSIS — Z8673 Personal history of transient ischemic attack (TIA), and cerebral infarction without residual deficits: Secondary | ICD-10-CM | POA: Diagnosis not present

## 2023-11-23 DIAGNOSIS — E1159 Type 2 diabetes mellitus with other circulatory complications: Secondary | ICD-10-CM | POA: Diagnosis not present

## 2023-11-23 DIAGNOSIS — E782 Mixed hyperlipidemia: Secondary | ICD-10-CM

## 2023-11-23 DIAGNOSIS — E1169 Type 2 diabetes mellitus with other specified complication: Secondary | ICD-10-CM

## 2023-11-23 DIAGNOSIS — Z23 Encounter for immunization: Secondary | ICD-10-CM | POA: Diagnosis not present

## 2023-11-23 DIAGNOSIS — E1165 Type 2 diabetes mellitus with hyperglycemia: Secondary | ICD-10-CM | POA: Diagnosis not present

## 2023-11-23 DIAGNOSIS — E559 Vitamin D deficiency, unspecified: Secondary | ICD-10-CM

## 2023-11-23 LAB — POCT CBG (FASTING - GLUCOSE)-MANUAL ENTRY: Glucose Fasting, POC: 100 mg/dL — AB (ref 70–99)

## 2023-11-23 NOTE — Progress Notes (Signed)
 Established Patient Office Visit  Subjective:  Patient ID: Karen Chan, female    DOB: 03-30-1950  Age: 73 y.o. MRN: [id number]  Chief Complaint  Patient presents with   Follow-up    1 month follow up    Patient comes in for a follow-up today, accompanied by her daughter.  She is feeling well and is in good spirits.  She is tolerating Ozempic  at 0.5 mg/week and has lost more weight now.  Denies nausea or vomiting, no abdominal pain and no constipation.  Patient is agreeable to increasing the dose to 1 mg/week. She has still not had her mammogram yet, will be scheduling soon.    No other concerns at this time.   Past Medical History:  Diagnosis Date   Acute ischemic left posterior cerebral artery (PCA) stroke (HCC)    Essential hypertension, benign    Impaired glucose tolerance in obese    Mixed hyperlipidemia    Non-seasonal allergic rhinitis    Stroke (HCC)    Vitamin D  deficiency     Past Surgical History:  Procedure Laterality Date   lateral malleolar fracture Right     Social History   Socioeconomic History   Marital status: Married    Spouse name: Not on file   Number of children: Not on file   Years of education: Not on file   Highest education level: Not on file  Occupational History   Not on file  Tobacco Use   Smoking status: Never   Smokeless tobacco: Never  Vaping Use   Vaping status: Never Used  Substance and Sexual Activity   Alcohol use: Never   Drug use: Never   Sexual activity: Not Currently  Other Topics Concern   Not on file  Social History Narrative   Not on file   Social Drivers of Health   Financial Resource Strain: Not on file  Food Insecurity: No Food Insecurity (09/15/2021)   Hunger Vital Sign    Worried About Running Out of Food in the Last Year: Never true    Ran Out of Food in the Last Year: Never true  Transportation Needs: No Transportation Needs (09/15/2021)   PRAPARE - Administrator, Civil Service  (Medical): No    Lack of Transportation (Non-Medical): No  Physical Activity: Not on file  Stress: No Stress Concern Present (01/22/2023)   [employer] of Occupational Health - Occupational Stress Questionnaire    Feeling of Stress : Not at all  Social Connections: Not on file  Intimate Partner Violence: Not At Risk (01/22/2023)   Humiliation, Afraid, Rape, and Kick questionnaire    Fear of Current or Ex-Partner: No    Emotionally Abused: No    Physically Abused: No    Sexually Abused: No    Family History  Problem Relation Age of Onset   Hypertension Mother    Coronary artery disease Father    Breast cancer Neg Hx     Allergies  Allergen Reactions   Penicillins Rash    Outpatient Medications Prior to Visit  Medication Sig   acetaminophen  (TYLENOL ) 325 MG tablet Take 2 tablets (650 mg total) by mouth every 4 (four) hours as needed for mild pain (or temp > 37.5 C (99.5 F)).   amLODipine  (NORVASC ) 5 MG tablet TAKE 1 TABLET BY MOUTH EVERY DAY   ASPIRIN  LOW DOSE 81 MG tablet TAKE 1 TABLET BY MOUTH EVERY DAY   atorvastatin  (LIPITOR) 40 MG tablet TAKE 1 TABLET BY  MOUTH EVERY DAY   carvedilol  (COREG ) 25 MG tablet TAKE 1 TABLET BY MOUTH TWICE A DAY   citalopram (CELEXA) 20 MG tablet TAKE 1 TABLET BY MOUTH EVERY DAY   fluticasone (FLONASE) 50 MCG/ACT nasal spray SPRAY 2 SPRAYS INTO EACH NOSTRIL EVERY DAY   hydrochlorothiazide  (HYDRODIURIL ) 25 MG tablet TAKE 1 TABLET BY MOUTH EVERY DAY   irbesartan  (AVAPRO ) 300 MG tablet TAKE 1 TABLET BY MOUTH EVERY DAY   meloxicam  (MOBIC ) 7.5 MG tablet TAKE 1 TABLET BY MOUTH EVERY DAY   Vitamin D , Ergocalciferol , (DRISDOL ) 1.25 MG (50000 UNIT) CAPS capsule Take 1 capsule (50,000 Units total) by mouth once a week.   [DISCONTINUED] Semaglutide ,0.25 or 0.5MG /DOS, 2 MG/3ML SOPN Inject 0.5 mg into the skin once a week.   No facility-administered medications prior to visit.    Review of Systems  Constitutional: Negative.  Negative for chills,  fever and malaise/fatigue.  HENT: Negative.  Negative for congestion and sore throat.   Eyes: Negative.  Negative for blurred vision and pain.  Respiratory: Negative.  Negative for cough and shortness of breath.   Cardiovascular: Negative.  Negative for chest pain, palpitations and leg swelling.  Gastrointestinal: Negative.  Negative for abdominal pain, blood in stool, constipation, diarrhea, heartburn, melena, nausea and vomiting.  Genitourinary: Negative.  Negative for dysuria, flank pain, frequency and urgency.  Musculoskeletal: Negative.  Negative for joint pain and myalgias.  Skin: Negative.   Neurological: Negative.  Negative for dizziness, tingling, sensory change, weakness and headaches.  Endo/Heme/Allergies: Negative.   Psychiatric/Behavioral: Negative.  Negative for depression and suicidal ideas. The patient is not nervous/anxious.        Objective:   BP 132/78   Pulse 85   Ht 5' 3 (1.6 m)   Wt 249 lb (112.9 kg)   SpO2 93%   BMI 44.11 kg/m   Vitals:   11/23/23 1129  BP: 132/78  Pulse: 85  Height: 5' 3 (1.6 m)  Weight: 249 lb (112.9 kg)  SpO2: 93%  BMI (Calculated): 44.12    Physical Exam Vitals and nursing note reviewed.  Constitutional:      Appearance: Normal appearance.  HENT:     Head: Normocephalic and atraumatic.     Nose: Nose normal.     Mouth/Throat:     Mouth: Mucous membranes are moist.     Pharynx: Oropharynx is clear.  Eyes:     Conjunctiva/sclera: Conjunctivae normal.     Pupils: Pupils are equal, round, and reactive to light.  Cardiovascular:     Rate and Rhythm: Normal rate and regular rhythm.     Pulses: Normal pulses.     Heart sounds: Normal heart sounds. No murmur heard. Pulmonary:     Effort: Pulmonary effort is normal.     Breath sounds: Normal breath sounds. No wheezing.  Abdominal:     General: Bowel sounds are normal.     Palpations: Abdomen is soft.     Tenderness: There is no abdominal tenderness. There is no right CVA  tenderness or left CVA tenderness.  Musculoskeletal:        General: Normal range of motion.     Cervical back: Normal range of motion.     Right lower leg: No edema.     Left lower leg: No edema.  Skin:    General: Skin is warm and dry.  Neurological:     General: No focal deficit present.     Mental Status: She is alert and oriented to person,  place, and time.  Psychiatric:        Mood and Affect: Mood normal.        Behavior: Behavior normal.      Results for orders placed or performed in visit on 11/23/23  POCT CBG (Fasting - Glucose)  Result Value Ref Range   Glucose Fasting, POC 100 (A) 70 - 99 mg/dL    Recent Results (from the past 2160 hours)  POCT CBG (Fasting - Glucose)     Status: Abnormal   Collection Time: 09/20/23  9:45 AM  Result Value Ref Range   Glucose Fasting, POC 144 (A) 70 - 99 mg/dL  [id number]     Status: Abnormal   Collection Time: 09/20/23 10:08 AM  Result Value Ref Range   Glucose 120 (H) 70 - 99 mg/dL   BUN 15 8 - 27 mg/dL   Creatinine, Ser 8.96 (H) 0.57 - 1.00 mg/dL   eGFR 58 (L) >40 fO/fpw/8.26   BUN/Creatinine Ratio 15 12 - 28   Sodium 140 134 - 144 mmol/L   Potassium 4.0 3.5 - 5.2 mmol/L   Chloride 100 96 - 106 mmol/L   CO2 23 20 - 29 mmol/L   Calcium  9.6 8.7 - 10.3 mg/dL   Total Protein 7.9 6.0 - 8.5 g/dL   Albumin 4.3 3.9 - 4.9 g/dL   Globulin, Total 3.6 1.5 - 4.5 g/dL   Bilirubin Total 0.7 0.0 - 1.2 mg/dL   Alkaline Phosphatase 91 44 - 121 IU/L   AST 20 0 - 40 IU/L   ALT 17 0 - 32 IU/L  Lipid Panel w/o Chol/HDL Ratio     Status: None   Collection Time: 09/20/23 10:08 AM  Result Value Ref Range   Cholesterol, Total 189 100 - 199 mg/dL   Triglycerides 73 0 - 149 mg/dL   HDL 98 >60 mg/dL   VLDL Cholesterol Cal 13 5 - 40 mg/dL   LDL Chol Calc (NIH) 78 0 - 99 mg/dL  Vitamin D  (25 hydroxy)     Status: None   Collection Time: 09/20/23 10:08 AM  Result Value Ref Range   Vit D, 25-Hydroxy 37.9 30.0 - 100.0 ng/mL    Comment: Vitamin  D deficiency has been defined by the Institute of Medicine and an Endocrine Society practice guideline as a level of serum 25-OH vitamin D  less than 20 ng/mL (1,2). The Endocrine Society went on to further define vitamin D  insufficiency as a level between 21 and 29 ng/mL (2). 1. IOM (Institute of Medicine). 2010. Dietary reference    intakes for calcium  and D. [place]: The    [employer]. 2. Holick MF, Binkley [place], Bischoff-Ferrari HA, et al.    Evaluation, treatment, and prevention of vitamin D     deficiency: an Endocrine Society clinical practice    guideline. JCEM. 2011 Jul; 96(7):1911-30.   Hemoglobin A1c     Status: Abnormal   Collection Time: 09/20/23 10:08 AM  Result Value Ref Range   Hgb A1c MFr Bld 6.3 (H) 4.8 - 5.6 %    Comment:          Prediabetes: 5.7 - 6.4          Diabetes: >6.4          Glycemic control for adults with diabetes: <7.0    Est. average glucose Bld gHb Est-mCnc 134 mg/dL  POCT CBG (Fasting - Glucose)     Status: Abnormal   Collection Time: 10/22/23 10:31 AM  Result Value  Ref Range   Glucose Fasting, POC 106 (A) 70 - 99 mg/dL  POCT CBG (Fasting - Glucose)     Status: Abnormal   Collection Time: 11/23/23 11:36 AM  Result Value Ref Range   Glucose Fasting, POC 100 (A) 70 - 99 mg/dL      Assessment & Plan:  Increase Ozempic  to 1 mg/week.  Strict diet control.  Will check labs at next visit. Problem List Items Addressed This Visit     Vitamin D  deficiency   Hypertension associated with diabetes (HCC)   Relevant Medications   Semaglutide , 1 MG/DOSE, 4 MG/3ML SOPN   Type 2 diabetes mellitus with hyperglycemia, without long-term current use of insulin (HCC) - Primary   Relevant Medications   Semaglutide , 1 MG/DOSE, 4 MG/3ML SOPN   Other Relevant Orders   POCT CBG (Fasting - Glucose) (Completed)   Combined hyperlipidemia associated with type 2 diabetes mellitus (HCC)   Relevant Medications   Semaglutide , 1 MG/DOSE, 4 MG/3ML SOPN    History of CVA (cerebrovascular accident)   Other Visit Diagnoses       Morbid obesity (HCC)       Relevant Medications   Semaglutide , 1 MG/DOSE, 4 MG/3ML SOPN     Flu vaccine need       Relevant Orders   Flu Vaccine Trivalent High Dose (Fluad) (Completed)       Return in about 6 weeks (around 01/04/2024).   Total time spent: 30 minutes. This time includes review of previous notes and results and patient face to face interaction during today's visit.    FERNAND FREDY RAMAN, MD  11/23/2023   This document may have been prepared by [facility] Voice Recognition software and as such may include unintentional dictation errors.

## 2024-01-02 NOTE — Progress Notes (Signed)
   01/02/2024  Patient ID: Karen Chan, female   DOB: 10/06/1950, 73 y.o.   MRN: [id number]  Pharmacy Quality Measure Review  This patient is appearing on a report for being at risk of failing the adherence measure for diabetes medications this calendar year.   Medication: Ozempic  Last fill date: 12/19/23 for 28 day supply  Insurance report was not up to date. No action needed at this time.   Jon VEAR Lindau, PharmD Clinical Pharmacist [phone]

## 2024-01-06 ENCOUNTER — Other Ambulatory Visit: Payer: Self-pay | Admitting: Internal Medicine

## 2024-01-11 ENCOUNTER — Ambulatory Visit: Admitting: Internal Medicine

## 2024-01-15 NOTE — Progress Notes (Signed)
   01/15/2024  Patient ID: Karen Chan, female   DOB: October 23, 1950, 73 y.o.   MRN: [id number]  Pharmacy Quality Measure Review  This patient is appearing on a report for being at risk of failing the adherence measure for cholesterol (statin) and hypertension (ACEi/ARB) medications this calendar year.   Medication: Atorvastatin  and Irbesartan  Last fill date: 12/30/23 for 90 day supply  Insurance report was not up to date. No action needed at this time.   Jon VEAR Lindau, PharmD Clinical Pharmacist [phone]
